# Patient Record
Sex: Male | Born: 1935 | Race: Black or African American | Hispanic: No | Marital: Married | Smoking: Former smoker
Health system: Southern US, Community
[De-identification: ages and names within clinical notes are randomized; demographics above are authoritative.]

## PROBLEM LIST (undated history)

## (undated) DIAGNOSIS — R0602 Shortness of breath: Secondary | ICD-10-CM

## (undated) DIAGNOSIS — I509 Heart failure, unspecified: Secondary | ICD-10-CM

## (undated) DIAGNOSIS — I10 Essential (primary) hypertension: Secondary | ICD-10-CM

## (undated) HISTORY — PX: BLADDER SURGERY: SHX569

---

## 1998-03-14 ENCOUNTER — Ambulatory Visit (HOSPITAL_COMMUNITY): Admission: RE | Admit: 1998-03-14 | Discharge: 1998-03-14 | Payer: Self-pay | Admitting: *Deleted

## 1998-08-06 ENCOUNTER — Encounter: Payer: Self-pay | Admitting: Urology

## 1998-08-06 ENCOUNTER — Ambulatory Visit (HOSPITAL_BASED_OUTPATIENT_CLINIC_OR_DEPARTMENT_OTHER): Admission: RE | Admit: 1998-08-06 | Discharge: 1998-08-06 | Payer: Self-pay | Admitting: Urology

## 1999-08-01 ENCOUNTER — Observation Stay (HOSPITAL_COMMUNITY): Admission: EM | Admit: 1999-08-01 | Discharge: 1999-08-02 | Payer: Self-pay | Admitting: Emergency Medicine

## 1999-08-01 ENCOUNTER — Encounter: Payer: Self-pay | Admitting: Emergency Medicine

## 2000-11-28 ENCOUNTER — Emergency Department (HOSPITAL_COMMUNITY): Admission: EM | Admit: 2000-11-28 | Discharge: 2000-11-28 | Payer: Self-pay | Admitting: Emergency Medicine

## 2011-06-24 ENCOUNTER — Inpatient Hospital Stay (HOSPITAL_COMMUNITY): Payer: Medicare Other

## 2011-06-24 ENCOUNTER — Encounter (HOSPITAL_COMMUNITY): Payer: Self-pay | Admitting: Emergency Medicine

## 2011-06-24 ENCOUNTER — Other Ambulatory Visit: Payer: Self-pay

## 2011-06-24 ENCOUNTER — Emergency Department (HOSPITAL_COMMUNITY): Payer: Medicare Other

## 2011-06-24 ENCOUNTER — Inpatient Hospital Stay (HOSPITAL_COMMUNITY)
Admission: EM | Admit: 2011-06-24 | Discharge: 2011-06-26 | DRG: 199 | Disposition: A | Discharge status: Home Health | Payer: Medicare Other | Source: Ambulatory Visit | Attending: Internal Medicine | Admitting: Internal Medicine

## 2011-06-24 DIAGNOSIS — E860 Dehydration: Secondary | ICD-10-CM

## 2011-06-24 DIAGNOSIS — F4321 Adjustment disorder with depressed mood: Secondary | ICD-10-CM | POA: Diagnosis present

## 2011-06-24 DIAGNOSIS — R6251 Failure to thrive (child): Secondary | ICD-10-CM

## 2011-06-24 DIAGNOSIS — J939 Pneumothorax, unspecified: Secondary | ICD-10-CM

## 2011-06-24 DIAGNOSIS — I10 Essential (primary) hypertension: Secondary | ICD-10-CM

## 2011-06-24 DIAGNOSIS — IMO0002 Reserved for concepts with insufficient information to code with codable children: Secondary | ICD-10-CM

## 2011-06-24 DIAGNOSIS — Z87891 Personal history of nicotine dependence: Secondary | ICD-10-CM

## 2011-06-24 DIAGNOSIS — I509 Heart failure, unspecified: Secondary | ICD-10-CM | POA: Diagnosis present

## 2011-06-24 DIAGNOSIS — K5900 Constipation, unspecified: Secondary | ICD-10-CM | POA: Diagnosis present

## 2011-06-24 DIAGNOSIS — J9383 Other pneumothorax: Secondary | ICD-10-CM

## 2011-06-24 DIAGNOSIS — R627 Adult failure to thrive: Secondary | ICD-10-CM

## 2011-06-24 DIAGNOSIS — E43 Unspecified severe protein-calorie malnutrition: Secondary | ICD-10-CM

## 2011-06-24 HISTORY — DX: Shortness of breath: R06.02

## 2011-06-24 HISTORY — DX: Heart failure, unspecified: I50.9

## 2011-06-24 HISTORY — DX: Essential (primary) hypertension: I10

## 2011-06-24 LAB — URINALYSIS, ROUTINE W REFLEX MICROSCOPIC
Bilirubin Urine: NEGATIVE
Glucose, UA: NEGATIVE mg/dL
Ketones, ur: NEGATIVE mg/dL
Leukocytes, UA: NEGATIVE
pH: 6 (ref 5.0–8.0)

## 2011-06-24 LAB — CBC
HCT: 29.2 % — ABNORMAL LOW (ref 39.0–52.0)
Hemoglobin: 10 g/dL — ABNORMAL LOW (ref 13.0–17.0)
MCH: 31.4 pg (ref 26.0–34.0)
MCV: 92.4 fL (ref 78.0–100.0)
MCV: 91.8 fL (ref 78.0–100.0)
Platelets: 153 10*3/uL (ref 150–400)
Platelets: 145 10*3/uL — ABNORMAL LOW (ref 150–400)
RBC: 3.18 MIL/uL — ABNORMAL LOW (ref 4.22–5.81)
RDW: 13.2 % (ref 11.5–15.5)
WBC: 10 10*3/uL (ref 4.0–10.5)
WBC: 8.7 10*3/uL (ref 4.0–10.5)

## 2011-06-24 LAB — COMPREHENSIVE METABOLIC PANEL
ALT: 9 U/L (ref 0–53)
Alkaline Phosphatase: 50 U/L (ref 39–117)
CO2: 30 mEq/L (ref 19–32)
GFR calc Af Amer: 50 mL/min — ABNORMAL LOW (ref >90)
GFR calc non Af Amer: 43 mL/min — ABNORMAL LOW (ref >90)
Glucose, Bld: 98 mg/dL (ref 70–99)
Potassium: 3.9 mEq/L (ref 3.5–5.1)
Sodium: 139 mEq/L (ref 135–145)
Total Bilirubin: 0.9 mg/dL (ref 0.3–1.2)

## 2011-06-24 LAB — CREATININE, SERUM: GFR calc Af Amer: 58 mL/min — ABNORMAL LOW (ref >90)

## 2011-06-24 LAB — DIFFERENTIAL
Basophils Absolute: 0 10*3/uL (ref 0.0–0.1)
Eosinophils Relative: 2 % (ref 0–5)
Lymphocytes Relative: 20 % (ref 12–46)

## 2011-06-24 LAB — URINE MICROSCOPIC-ADD ON

## 2011-06-24 MED ORDER — MORPHINE SULFATE 2 MG/ML IJ SOLN: 2.0000 mg | INTRAMUSCULAR | Status: DC | PRN

## 2011-06-24 MED ORDER — ONDANSETRON HCL 4 MG/2ML IJ SOLN: 4.0000 mg | Freq: Four times a day (QID) | INTRAMUSCULAR | Status: DC | PRN

## 2011-06-24 MED ORDER — ACETAMINOPHEN 650 MG RE SUPP: 650.0000 mg | Freq: Four times a day (QID) | RECTAL | Status: DC | PRN

## 2011-06-24 MED ORDER — SIMVASTATIN 40 MG PO TABS
40.0000 mg | ORAL_TABLET | Freq: Every morning | ORAL | Status: DC
  Administered 2011-06-24 – 2011-06-26 (×3): 40 mg via ORAL
  Filled 2011-06-24 (×3): qty 1

## 2011-06-24 MED ORDER — ENSURE CLINICAL ST REVIGOR PO LIQD
237.0000 mL | Freq: Three times a day (TID) | ORAL | Status: DC
  Administered 2011-06-24: 237 mL via ORAL
  Administered 2011-06-25: 14:00:00 via ORAL
  Administered 2011-06-25 – 2011-06-26 (×3): 237 mL via ORAL

## 2011-06-24 MED ORDER — ENOXAPARIN SODIUM 30 MG/0.3ML ~~LOC~~ SOLN
30.0000 mg | SUBCUTANEOUS | Status: DC
  Administered 2011-06-24 – 2011-06-25 (×2): 30 mg via SUBCUTANEOUS
  Filled 2011-06-24 (×3): qty 0.3

## 2011-06-24 MED ORDER — ONDANSETRON HCL 4 MG PO TABS: 4.0000 mg | ORAL_TABLET | Freq: Four times a day (QID) | ORAL | Status: DC | PRN

## 2011-06-24 MED ORDER — SODIUM CHLORIDE 0.9 % IV SOLN
INTRAVENOUS | Status: DC
  Administered 2011-06-24 – 2011-06-25 (×2): via INTRAVENOUS

## 2011-06-24 MED ORDER — ALPRAZOLAM 0.25 MG PO TABS: 0.2500 mg | ORAL_TABLET | Freq: Two times a day (BID) | ORAL | Status: DC | PRN

## 2011-06-24 MED ORDER — ACETAMINOPHEN 325 MG PO TABS: 650.0000 mg | ORAL_TABLET | Freq: Four times a day (QID) | ORAL | Status: DC | PRN

## 2011-06-24 MED ORDER — POLYETHYLENE GLYCOL 3350 17 G PO PACK
17.0000 g | PACK | Freq: Every day | ORAL | Status: DC
  Administered 2011-06-24 – 2011-06-25 (×2): 17 g via ORAL
  Filled 2011-06-24 (×2): qty 1

## 2011-06-24 MED ORDER — OXYCODONE HCL 5 MG PO TABS: 5.0000 mg | ORAL_TABLET | ORAL | Status: DC | PRN

## 2011-06-24 NOTE — H&P (Signed)
PCP:   Geraldo Pitter, MD, MD   Chief Complaint:  Poor appetite past few weeks, Weight loss of 2 pounds in last one month. Recent bereavement. Left flank pain.  HPI: This is 78 year old male, with history of HTN, s/p surgery for benign bladder tumor approximately 20 years ago, but otherwise, no significant medical history, brought by his family to ED, after he called his daughter Elease Hashimoto by phone this morning, with complaint of left upper back/flank pain. According to patient, he was doing some house cleaning about a month ago, when he had sudden onset left upper back pain, and had complained to his daughter at that time, that he felt as if he had "gas" in his back. According to family, he has been mildly SOBOE for one year, but this has become worse lately. He has no cough, chills or pleuritic-type chest pain. Patient lost his wife on 06/18/11, and she was buried on 06/22/11, so he is understandable, depressed. He has had poor appetite in the past few week, and lost about 2 lbs in weight in the past one month.  Allergies:  No Known Allergies According to family, patient is allergic to iv dye.   Past Medical History  Diagnosis Date  . Hypertension     Past Surgical History  Procedure Date  . Bladder surgery     had tumor removed 20 yrs ago    Prior to Admission medications   Medication Sig Start Date End Date Taking? Authorizing Provider  losartan-hydrochlorothiazide (HYZAAR) 100-25 MG per tablet Take 1 tablet by mouth daily.   Yes Historical Provider, MD  simvastatin (ZOCOR) 40 MG tablet Take 40 mg by mouth every morning.   Yes Historical Provider, MD    Social History:  does not have a smoking history on file. He does not have any smokeless tobacco history on file. His alcohol and drug histories not on file. Patient quit smoking >40 years ago.  History reviewed. No pertinent family history.  Review of Systems:  As per HPI and chief complaint. Patent denies fever, chills,  headache, blurred vision, difficulty in speaking, dysphagia, chest pain, cough, orthopnea, paroxysmal nocturnal dyspnea, nausea, diaphoresis, abdominal pain, vomiting, diarrhea, belching, heartburn, hematemesis, melena, dysuria, nocturia, urinary frequency, hematochezia, lower extremity swelling, pain, or redness. No bowel movement for last 3 days. The rest of the systems review is negative.  Physical Exam:  General:  Patient looks cachectic, does not appear to be in obvious acute distress. Alert, communicative, fully oriented, talking in complete sentences, not short of breath at rest. Buccal mucosa appears "dry".  HEENT:  Mild clinical pallor, no jaundice, no conjunctival injection or discharge. NECK:  Supple, JVP not seen, no carotid bruits, no palpable lymphadenopathy, no palpable goiter. CHEST:  Clinically clear to auscultation, no wheezes, no crackles. Breath sounds quiet over left apex. HEART:  Sounds 1 and 2 heard, normal, regular, no murmurs. ABDOMEN:  Flat, soft, non-tender, no palpable organomegaly, no palpable masses, normal bowel sounds. GENITALIA:  Not examined. LOWER EXTREMITIES:  No pitting edema, palpable peripheral pulses. MUSCULOSKELETAL SYSTEM:  Generalized osteoarthritic changes, otherwise, normal. CENTRAL NERVOUS SYSTEM:  No focal neurologic deficit on gross examination.  Labs on Admission:  Results for orders placed during the hospital encounter of 06/24/11 (from the past 48 hour(s))  COMPREHENSIVE METABOLIC PANEL     Status: Abnormal   Collection Time   06/24/11  8:16 AM      Component Value Range Comment   Sodium 139  135 - 145 (  mEq/L)    Potassium 3.9  3.5 - 5.1 (mEq/L)    Chloride 102  96 - 112 (mEq/L)    CO2 30  19 - 32 (mEq/L)    Glucose, Bld 98  70 - 99 (mg/dL)    BUN 27 (*) 6 - 23 (mg/dL)    Creatinine, Ser 1.61 (*) 0.50 - 1.35 (mg/dL)    Calcium 9.9  8.4 - 10.5 (mg/dL)    Total Protein 7.0  6.0 - 8.3 (g/dL)    Albumin 3.6  3.5 - 5.2 (g/dL)    AST 15  0 -  37 (U/L)    ALT 9  0 - 53 (U/L)    Alkaline Phosphatase 50  39 - 117 (U/L)    Total Bilirubin 0.9  0.3 - 1.2 (mg/dL)    GFR calc non Af Amer 43 (*) >90 (mL/min)    GFR calc Af Amer 50 (*) >90 (mL/min)   LIPASE, BLOOD     Status: Normal   Collection Time   06/24/11  8:16 AM      Component Value Range Comment   Lipase 35  11 - 59 (U/L)   LACTIC ACID, PLASMA     Status: Normal   Collection Time   06/24/11  8:16 AM      Component Value Range Comment   Lactic Acid, Venous 1.0  0.5 - 2.2 (mmol/L)   TROPONIN I     Status: Normal   Collection Time   06/24/11  8:16 AM      Component Value Range Comment   Troponin I <0.30  <0.30 (ng/mL)   CBC     Status: Abnormal   Collection Time   06/24/11  8:16 AM      Component Value Range Comment   WBC 10.0  4.0 - 10.5 (K/uL)    RBC 3.53 (*) 4.22 - 5.81 (MIL/uL)    Hemoglobin 11.1 (*) 13.0 - 17.0 (g/dL)    HCT 09.6 (*) 04.5 - 52.0 (%)    MCV 92.4  78.0 - 100.0 (fL)    MCH 31.4  26.0 - 34.0 (pg)    MCHC 34.0  30.0 - 36.0 (g/dL)    RDW 40.9  81.1 - 91.4 (%)    Platelets 153  150 - 400 (K/uL)   DIFFERENTIAL     Status: Abnormal   Collection Time   06/24/11  8:16 AM      Component Value Range Comment   Neutrophils Relative 61  43 - 77 (%)    Neutro Abs 6.1  1.7 - 7.7 (K/uL)    Lymphocytes Relative 20  12 - 46 (%)    Lymphs Abs 2.0  0.7 - 4.0 (K/uL)    Monocytes Relative 16 (*) 3 - 12 (%)    Monocytes Absolute 1.6 (*) 0.1 - 1.0 (K/uL)    Eosinophils Relative 2  0 - 5 (%)    Eosinophils Absolute 0.2  0.0 - 0.7 (K/uL)    Basophils Relative 0  0 - 1 (%)    Basophils Absolute 0.0  0.0 - 0.1 (K/uL)   URINALYSIS, ROUTINE W REFLEX MICROSCOPIC     Status: Abnormal   Collection Time   06/24/11  8:40 AM      Component Value Range Comment   Color, Urine YELLOW  YELLOW     APPearance CLEAR  CLEAR     Specific Gravity, Urine 1.020  1.005 - 1.030     pH 6.0  5.0 - 8.0  Glucose, UA NEGATIVE  NEGATIVE (mg/dL)    Hgb urine dipstick MODERATE (*) NEGATIVE       Bilirubin Urine NEGATIVE  NEGATIVE     Ketones, ur NEGATIVE  NEGATIVE (mg/dL)    Protein, ur NEGATIVE  NEGATIVE (mg/dL)    Urobilinogen, UA 0.2  0.0 - 1.0 (mg/dL)    Nitrite NEGATIVE  NEGATIVE     Leukocytes, UA NEGATIVE  NEGATIVE    URINE MICROSCOPIC-ADD ON     Status: Normal   Collection Time   06/24/11  8:40 AM      Component Value Range Comment   RBC / HPF 0-2  <3 (RBC/hpf)     Radiological Exams on Admission: *RADIOLOGY REPORT*  Clinical Data: Shortness of breath, CHF  CHEST - 2 VIEW  Comparison: Dictated report from portable chest x-ray of  08/06/1998.  Findings: There is a left pneumothorax present of approximately 15-  20%. Opacity is noted within the left upper lobe which may be  chronic, but an active process cannot be excluded. Opacity at the  left lung base may represent scarring or active process as well.  The right lung is clear and somewhat hyperaerated. Mediastinal  contours are within normal limits. The heart is normal in size.  No acute bony abnormality is seen.  IMPRESSION:  1. Approximately 20% left pneumothorax.  2. Prominent markings in the left lung apex and at the left lung  base may be chronic but an active process cannot be excluded.  I contacted Dr. Clarene Duke concerning this report at the time of  dictation.  Original Report Authenticated By: Juline Patch, M.D.   Assessment/Plan Principal Problem:  *FTT (failure to thrive) in adult: Patient presents with failure too thrive, diminished oral intake and and weight loss of 2 lbs over last month or so, without dysphagia or altered bowel habit. This has been exacerbated by recent bereavement. No clinical evidence of infection has been elicited. Will check TSH and request nutritionist consultation. Active Problems:  1. Protein-calorie malnutrition, severe: Secondary to above. Patient is cachectic. As decribed above will request nutritionist input.   2. Dehydration: Evidenced by elevated BUN/Creatinine  ratio. Will manage with gentle iv fluids.  3. HTN (hypertension): Patient is normotensive at this time.  4. Pneumothorax on left: This appears to be spontaneous, and acuity is uncertain. Per history, patient appears to have become symptomatic one month ago, but his left upper back/flank pain of this AM, could point to more recent occurrence or extension. According to Dr Clarene Duke, ED MD, she discussed this with Dr Robby Sermon, who suggested that this is likely chronic, and recommended observation, with serial CXR. Patient is not dyspnoiec at rest, has no chest pain, and is saturating well over 90% on RA. Will Evaluate further, with chest CT scan, and consult PCCM.  5. Constipation: This is not chronic, and patient's last bowel movement was 3 days ago. Will address with laxatives.  6. Recent bereavement: patient lost his wife of many years, on 06/18/11. He appears clinically depressed, which is an anticipated grief reaction. He will benefit from prn anxiolytics.  Further management will depend on clinical course.  Comment: I have discussed code status with family, and for, now, patient is FULL CODE.  Time Spent on Admission: 1 hour.  Luke Rigsbee,CHRISTOPHER 06/24/2011, 11:57 AM

## 2011-06-24 NOTE — ED Notes (Signed)
Called report to terry

## 2011-06-24 NOTE — ED Provider Notes (Signed)
History     CSN: [id number]  Arrival date & time 06/24/11  4098   First MD Initiated Contact with Patient 06/24/11 [phone]      Chief Complaint  Patient presents with  . Failure To Thrive    HPI Pt was seen at 0805.  Per pt and family, c/o gradual onset and worsening of persistent generalized weakness for the past 2-3 weeks.  Has been associated with poor PO intake, significant weight loss, as well as left sided mid-back "pain."  Pain worsens with palpation of the area and body movement.  Denies abd pain, no flank pain, no dysuria/hematuria, no CP/SOB, no cough, no fevers, no injury/falls.     Past Medical History  Diagnosis Date  . Hypertension     Past Surgical History  Procedure Date  . Bladder surgery     had tumor removed 20 yrs ago    History  Substance Use Topics  . Smoking status: Not on file  . Smokeless tobacco: Not on file  . Alcohol Use:     Review of Systems ROS: Statement: All systems negative except as marked or noted in the HPI; Constitutional: Negative for fever and chills. ; ; Eyes: Negative for eye pain, redness and discharge. ; ; ENMT: Negative for ear pain, hoarseness, nasal congestion, sinus pressure and sore throat. ; ; Cardiovascular: Negative for chest pain, palpitations, diaphoresis, dyspnea and peripheral edema. ; ; Respiratory: Negative for cough, wheezing and stridor. ; ; Gastrointestinal: Negative for nausea, vomiting, diarrhea, abdominal pain, blood in stool, hematemesis, jaundice and rectal bleeding. . ; ; Genitourinary: Negative for dysuria, flank pain and hematuria. ; ; Musculoskeletal: +back pain, Negative for neck pain. Negative for swelling and trauma.; ; Skin: Negative for pruritus, rash, abrasions, blisters, bruising and skin lesion.; ; Neuro: +weakness. Negative for headache, lightheadedness and neck stiffness. Negative for altered level of consciousness , altered mental status, extremity weakness, paresthesias, involuntary movement, seizure  and syncope.     Allergies  Review of patient's allergies indicates no known allergies.  Home Medications   Current Outpatient Rx  Name Route Sig Dispense Refill  . LOSARTAN POTASSIUM-HCTZ 100-25 MG PO TABS Oral Take 1 tablet by mouth daily.    Marland Kitchen SIMVASTATIN 40 MG PO TABS Oral Take 40 mg by mouth every morning.      BP 113/58  Pulse 86  Temp(Src) 97.9 F (36.6 C) (Oral)  Resp 17  SpO2 99%  Physical Exam 0810: Physical examination:  Nursing notes reviewed; Vital signs and O2 SAT reviewed;  Constitutional: Thin, frail, In no acute distress; Head:  Normocephalic, atraumatic; Eyes: EOMI, PERRL, No scleral icterus; ENMT: Mouth and pharynx normal, Mucous membranes dry; Neck: Supple, Full range of motion, No lymphadenopathy; Cardiovascular: Regular rate and rhythm, No murmur, rub, or gallop; Respiratory: Breath sounds clear & equal bilaterally, No rales, rhonchi, wheezes, or rub, Normal respiratory effort/excursion, speaking full sentences with ease; Chest: Nontender, Movement normal; Abdomen: Soft, Nontender, Nondistended, Normal bowel sounds; Genitourinary: No CVA tenderness; Spine:  No midline CS, TS, LS tenderness, +mild TTP left hypertonic thoracic paraspinal muscles, no rash, no ecchymosis, no soft tissue crepitus. Extremities: Pulses normal, No tenderness, No edema, No calf edema or asymmetry.; Neuro: AA&Ox3, Major CN grossly intact.  No facial droop, speech clear. No gross focal motor or sensory deficits in extremities.; Skin: Color normal, Warm, Dry, no rash.    ED Course  Procedures   [phone]:  Pt's VS remain stable, not orthostatic.  Sats remain 99% R/A, not  tachycardic or tachypneic.  T/C to Neurosurgeon Dr. Dorris Fetch, case discussed, including:  HPI, pertinent PM/SHx, VS/PE, dx testing, ED course and treatment:  He has reviewed CXR, PTX not 20% (appears less % than that) and given pt's stability likely is chronic/not acute today, recommends medical admit, recheck CXR 6hours and  again in the morning.   Dx testing d/w pt and family.  Questions answered.  Verb understanding, agreeable to admit.  1000:  T/C to [facility], case discussed, including:  HPI, pertinent PM/SHx, VS/PE, dx testing, D/W Neurosurg MD, ED course and treatment.  Agreeable to admit.  Requests to obtain medical bed to team 6.    MDM  MDM Reviewed: nursing note, vitals and previous chart Reviewed previous: ECG Interpretation: labs, ECG and x-ray    Date: 06/24/2011  Rate: 70  Rhythm: normal sinus rhythm  QRS Axis: normal  Intervals: normal  ST/T Wave abnormalities: normal  Conduction Disutrbances:none  Narrative Interpretation:   Old EKG Reviewed: unchanged; no significant changes from previous EKG dated 08/01/1999.  Results for orders placed during the hospital encounter of 06/24/11  COMPREHENSIVE METABOLIC PANEL      Component Value Range   Sodium 139  135 - 145 (mEq/L)   Potassium 3.9  3.5 - 5.1 (mEq/L)   Chloride 102  96 - 112 (mEq/L)   CO2 30  19 - 32 (mEq/L)   Glucose, Bld 98  70 - 99 (mg/dL)   BUN 27 (*) 6 - 23 (mg/dL)   Creatinine, Ser 1.61 (*) 0.50 - 1.35 (mg/dL)   Calcium 9.9  8.4 - 09.6 (mg/dL)   Total Protein 7.0  6.0 - 8.3 (g/dL)   Albumin 3.6  3.5 - 5.2 (g/dL)   AST 15  0 - 37 (U/L)   ALT 9  0 - 53 (U/L)   Alkaline Phosphatase 50  39 - 117 (U/L)   Total Bilirubin 0.9  0.3 - 1.2 (mg/dL)   GFR calc non Af Amer 43 (*) >90 (mL/min)   GFR calc Af Amer 50 (*) >90 (mL/min)  LIPASE, BLOOD      Component Value Range   Lipase 35  11 - 59 (U/L)  LACTIC ACID, PLASMA      Component Value Range   Lactic Acid, Venous 1.0  0.5 - 2.2 (mmol/L)  TROPONIN I      Component Value Range   Troponin I <0.30  <0.30 (ng/mL)  URINALYSIS, ROUTINE W REFLEX MICROSCOPIC      Component Value Range   Color, Urine YELLOW  YELLOW    APPearance CLEAR  CLEAR    Specific Gravity, Urine 1.020  1.005 - 1.030    pH 6.0  5.0 - 8.0    Glucose, UA NEGATIVE  NEGATIVE (mg/dL)   Hgb urine dipstick MODERATE  (*) NEGATIVE    Bilirubin Urine NEGATIVE  NEGATIVE    Ketones, ur NEGATIVE  NEGATIVE (mg/dL)   Protein, ur NEGATIVE  NEGATIVE (mg/dL)   Urobilinogen, UA 0.2  0.0 - 1.0 (mg/dL)   Nitrite NEGATIVE  NEGATIVE    Leukocytes, UA NEGATIVE  NEGATIVE   CBC      Component Value Range   WBC 10.0  4.0 - 10.5 (K/uL)   RBC 3.53 (*) 4.22 - 5.81 (MIL/uL)   Hemoglobin 11.1 (*) 13.0 - 17.0 (g/dL)   HCT 04.5 (*) 40.9 - 52.0 (%)   MCV 92.4  78.0 - 100.0 (fL)   MCH 31.4  26.0 - 34.0 (pg)   MCHC 34.0  30.0 -  36.0 (g/dL)   RDW 60.4  54.0 - 98.1 (%)   Platelets 153  150 - 400 (K/uL)  DIFFERENTIAL      Component Value Range   Neutrophils Relative 61  43 - 77 (%)   Neutro Abs 6.1  1.7 - 7.7 (K/uL)   Lymphocytes Relative 20  12 - 46 (%)   Lymphs Abs 2.0  0.7 - 4.0 (K/uL)   Monocytes Relative 16 (*) 3 - 12 (%)   Monocytes Absolute 1.6 (*) 0.1 - 1.0 (K/uL)   Eosinophils Relative 2  0 - 5 (%)   Eosinophils Absolute 0.2  0.0 - 0.7 (K/uL)   Basophils Relative 0  0 - 1 (%)   Basophils Absolute 0.0  0.0 - 0.1 (K/uL)  URINE MICROSCOPIC-ADD ON      Component Value Range   RBC / HPF 0-2  <3 (RBC/hpf)   Dg Chest 2 View 06/24/2011  *RADIOLOGY REPORT*  Clinical Data: Shortness of breath, CHF  CHEST - 2 VIEW  Comparison: Dictated report from portable chest x-ray of 08/06/1998.  Findings: There is a left pneumothorax present of approximately 15- 20%.  Opacity is noted within the left upper lobe which may be chronic, but an active process cannot be excluded. Opacity at the left lung base may represent scarring or active process as well. The right lung is clear and somewhat hyperaerated.  Mediastinal contours are within normal limits.  The heart is normal in size. No acute bony abnormality is seen.  IMPRESSION:  1.  Approximately 20% left pneumothorax. 2.  Prominent markings in the left lung apex and at the left lung base may be chronic but an active process cannot be excluded.  I contacted Dr. Clarene Duke concerning this report  at the time of dictation.  Original Report Authenticated By: Juline Patch, M.D.            Laray Anger, DO 06/25/11 [phone]

## 2011-06-24 NOTE — Consult Note (Signed)
Name: Marcus Clark MRN: [id number] DOB: 03/14/1916    LOS: 0  [facility] Note   History of Present Illness:  78 y/o AAM, former smoker, with PMH of CHF, HTN, and removal of bladder tumor 20 yrs ago, recent loss of spouse (deceased on 06/30/22) presented on 2/19 with "gas like" pain in back.  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  Noted to have lost weight to 120's from baseline of 140's over past year.  CXR in ED demonstrated approximate 20% left pneumothorax, chronic changes concerning for fibrosis.  PCCM consulted for PTX.    Tests / Events: 2/19 CT CHEST>>>Given cross modality comparison, similar, approximately 20% left-sided hydropneumothorax. Partial collapse of the left lung. Areas of left apical and basilar fibrosis. Given absent fibrosis on the right, considerations would include post infectious/inflammatory fibrosis or an atypical appearance of interstitial lung disease (i.e. usual interstitial pneumonitis or nonspecific interstitial pneumonitis.  Mildly motion degraded exam.    Past Medical History  Diagnosis Date  . Hypertension   . CHF (congestive heart failure)   . Shortness of breath     Past Surgical History  Procedure Date  . Bladder surgery     had tumor removed 20 yrs ago    Prior to Admission medications   Medication Sig Start Date End Date Taking? Authorizing Provider  losartan-hydrochlorothiazide (HYZAAR) 100-25 MG per tablet Take 1 tablet by mouth daily.   Yes Historical Provider, MD  simvastatin (ZOCOR) 40 MG tablet Take 40 mg by mouth every morning.   Yes Historical Provider, MD    Allergies Allergies  Allergen Reactions  . Contrast Media (Iodinated Diagnostic Agents)     Family History History reviewed. No pertinent family history.  Social History  reports that he has quit smoking. He has never used smokeless tobacco. He reports that he does not drink alcohol or use illicit drugs.  Review Of Systems:  Gen:  Denies fever, chills, fatigue, night sweats.  Gradual weight loss over last year HEENT: Denies blurred vision, double vision, hearing loss, tinnitus, sinus congestion, rhinorrhea, sore throat, neck stiffness, dysphagia PULM: Denies shortness of breath, cough, sputum production, hemoptysis, wheezing.  SEE HPI CV: Denies chest pain, edema, orthopnea, paroxysmal nocturnal dyspnea, palpitations GI: Denies abdominal pain, nausea, vomiting, diarrhea, hematochezia, melena, constipation, change in bowel habits GU: Denies dysuria, hematuria, polyuria, oliguria, urethral discharge Endocrine: Denies hot or cold intolerance, polyuria, polyphagia or appetite change Derm: Denies rash, dry skin, scaling or peeling skin change Heme: Denies easy bruising, bleeding, bleeding gums Neuro: Denies headache, numbness, weakness, slurred speech, loss of memory or consciousness  Vital Signs: Temp:  [96.9 F (36.1 C)-97.9 F (36.6 C)] 96.9 F (36.1 C) (02/19 1110) Pulse Rate:  [69-86] 74  (02/19 1110) Resp:  [17-18] 18  (02/19 1110) BP: (113-134)/(58-66) 134/66 mmHg (02/19 1110) SpO2:  [99 %-100 %] 100 % (02/19 1110)    Physical Examination: General: frail, elderly male in NAD Neuro: AAOx4, speech clear, MAE CV: s1s2 rrr, no m/r/g PULM: resp's even/non-labored on RA, lungs bilaterally clear GI: scaphoid, soft, bsx4 active Extremities:  Warm/dry, no edema  Labs    CBC  Lab 06/24/11 0816  HGB 11.1*  HCT 32.6*  WBC 10.0  PLT 153     BMET  Lab 06/24/11 0816  NA 139  K 3.9  CL 102  CO2 30  GLUCOSE 98  BUN 27*  CREATININE 1.37*  CALCIUM 9.9  MG --  PHOS --  Radiology: 2/19 CXR>>>Approximately 20% left pneumothorax.  Prominent markings in the left lung apex and at the left lung base may be chronic but an active process cannot be excluded    Assessment and Plan:  Spontaneous Left Pneumothorax Assessment: Frail elderly gentleman with 20% PTX.  Not in acute distress.   Plan: -serial  chest xrays to follow ptx -CT noted -add 100% NRB in setting of ptx -->ok for him to eat etc without mask on -hopeful to avoid CT placement -has grandson with multiple spnontaneous PTX's, family updated on plan of care, agree they hope to avoid CT placement -if acute distress, immediate cxr with presumed CT placement   Other current Medical Problems below per [facility]  FTT (failure to thrive) in adult / Protein-calorie malnutrition, severe / Dehydration   HTN (hypertension)  Constipation     Best practices / Disposition: -->Code Status:  -->DVT Px: -->GI Px: -->Diet:    Canary Brim, NP-C [facility] Pgr: [phone]  06/24/2011, 2:12 PM   PCCM Attending MD: Pt seen and examined and database reviewed. I agree with above findings, assessment and plan. The pneumothorax is relatively small and does not require intervention @ this point. The CT chest shows some adhesions in the pleural space which might serve to keep the lung "tacked up" preventing further collapse. Will recheck CXR in AM. Please call us sooner if he develops progressive respiratory distress  Billy Fischer, MD;  PCCM service; Mobile [phone]

## 2011-06-24 NOTE — Progress Notes (Signed)
Utilization Review Completed.Marcus Clark T2/19/2013   

## 2011-06-24 NOTE — Progress Notes (Signed)
INITIAL ADULT NUTRITION ASSESSMENT Date: 06/24/2011   Time: 2:10 PM  Reason for Assessment: Consult re: FTT, appears severely malnourished  ASSESSMENT: Male 78 y.o.  Dx: FTT (failure to thrive) in adult  Hx:  Past Medical History  Diagnosis Date  . Hypertension   . CHF (congestive heart failure)   . Shortness of breath    Related Meds:  Scheduled Meds:   . enoxaparin  30 mg Subcutaneous Q24H  . polyethylene glycol  17 g Oral Daily  . simvastatin  40 mg Oral q morning - 10a   Continuous Infusions:   . sodium chloride 75 mL/hr at 06/24/11 0826   PRN Meds:.acetaminophen, acetaminophen, ALPRAZolam, morphine, ondansetron (ZOFRAN) IV, ondansetron, oxyCODONE   Ht:  5\' 7"  (170.2 cm) per family  Wt:  122 lb (55.5 kg) per family  Ideal Wt: 67.3 kg % Ideal Wt: 82%  Usual Wt: 130 lb % Usual Wt: 94%  BMI: 19.2  Food/Nutrition Related Hx: Poor appetite and poor intake for the past few months with decline in wife's health and ability to prepare meals.  Wife passed away last week.  Patient is depressed per family, but eats well if someone eats with him.  Son and daughter prepare his meals.  Daughter bought a case of supplements for patient, but he has not been drinking them consistently because "he is stubborn" per daughter.  Labs:  CMP     Component Value Date/Time   NA 139 06/24/2011 0816   K 3.9 06/24/2011 0816   CL 102 06/24/2011 0816   CO2 30 06/24/2011 0816   GLUCOSE 98 06/24/2011 0816   BUN 27* 06/24/2011 0816   CREATININE 1.37* 06/24/2011 0816   CALCIUM 9.9 06/24/2011 0816   PROT 7.0 06/24/2011 0816   ALBUMIN 3.6 06/24/2011 0816   AST 15 06/24/2011 0816   ALT 9 06/24/2011 0816   ALKPHOS 50 06/24/2011 0816   BILITOT 0.9 06/24/2011 0816   GFRNONAA 43* 06/24/2011 0816   GFRAA 50* 06/24/2011 0816   Diet Order: Heart Healthy  Supplements/Tube Feeding: None  IVF:    sodium chloride Last Rate: 75 mL/hr at 06/24/11 0826    Estimated Nutritional Needs:   Kcal:  1500-1700 Protein: 75-85 grams Fluid: 1.5-1.7 liters  Patient is currently receiving an X-ray.  Spoke with his family (son, daughter, granddaughter) regarding nutrition history.  They request chocolate flavored supplements for patient.  Daughter reports that patient does not like hospital food, but ate well at lunch today because he was hungry.  6% weight loss in the past month with intake </=50% of estimated energy requirement for 1 month is reflective of severe malnutrition in the context of social or environmental circumstances.  Patient had been eating poorly when wife was unable to prepare meals.  NUTRITION DIAGNOSIS: -Malnutrition (NI-5.2).  Status: Ongoing  RELATED TO: inadequate oral intake with limited access to food and poor appetite  AS EVIDENCED BY: 6% weight loss in 1 month  MONITORING/EVALUATION(Goals): Goal:  Adequate intake of meals and supplements to meet nutrition needs for repletion of nutrition stores.  Monitor:  Adequacy of oral intake, labs, weight trend.  EDUCATION NEEDS: -Education not appropriate at this time  INTERVENTION:  Ensure Clinical Strength TID between meals to maximize oral intake.  Consider liberalizing diet to Regular to increase intake.  Dietitian #:  [phone]  DOCUMENTATION CODES Per approved criteria  -Severe  malnutrition in the context of social or environmental circumstances    Hettie Holstein 06/24/2011, 2:10 PM

## 2011-06-24 NOTE — ED Notes (Signed)
Pt son stated that pt wife died 2011-07-08 and was buried yesterday.  Pt has not been eating well since and family is concerned.

## 2011-06-24 NOTE — ED Notes (Signed)
Pt co left side abdominal pain.  Pt denies injury.  Denies N/V/D.  Pt states LBM Sunday, he hasnt been eating as much as normal.

## 2011-06-25 ENCOUNTER — Inpatient Hospital Stay (HOSPITAL_COMMUNITY): Payer: Medicare Other

## 2011-06-25 LAB — URINE CULTURE
Colony Count: NO GROWTH
Culture  Setup Time: 201302190920

## 2011-06-25 LAB — CBC
HCT: 27.3 % — ABNORMAL LOW (ref 39.0–52.0)
Hemoglobin: 9.2 g/dL — ABNORMAL LOW (ref 13.0–17.0)
MCH: 30.8 pg (ref 26.0–34.0)
MCHC: 33.7 g/dL (ref 30.0–36.0)
MCV: 91.3 fL (ref 78.0–100.0)
Platelets: 135 K/uL — ABNORMAL LOW (ref 150–400)
RBC: 2.99 MIL/uL — ABNORMAL LOW (ref 4.22–5.81)
RDW: 13.1 % (ref 11.5–15.5)
WBC: 7.1 K/uL (ref 4.0–10.5)

## 2011-06-25 LAB — COMPREHENSIVE METABOLIC PANEL
ALT: 7 U/L (ref 0–53)
AST: 13 U/L (ref 0–37)
Albumin: 2.6 g/dL — ABNORMAL LOW (ref 3.5–5.2)
Chloride: 107 mEq/L (ref 96–112)
Creatinine, Ser: 1.18 mg/dL (ref 0.50–1.35)
Potassium: 3.7 mEq/L (ref 3.5–5.1)
Sodium: 139 mEq/L (ref 135–145)
Total Bilirubin: 0.6 mg/dL (ref 0.3–1.2)

## 2011-06-25 LAB — TSH: TSH: 1.094 u[IU]/mL (ref 0.350–4.500)

## 2011-06-25 MED ORDER — POLYETHYLENE GLYCOL 3350 17 G PO PACK
17.0000 g | PACK | Freq: Every day | ORAL | Status: DC
  Administered 2011-06-26: 17 g via ORAL
  Filled 2011-06-25 (×2): qty 1

## 2011-06-25 MED ORDER — BIOTENE DRY MOUTH MT LIQD: 15.0000 mL | OROMUCOSAL | Status: DC | PRN

## 2011-06-25 MED ORDER — SENNOSIDES-DOCUSATE SODIUM 8.6-50 MG PO TABS
1.0000 | ORAL_TABLET | Freq: Every day | ORAL | Status: DC
  Administered 2011-06-25: 1 via ORAL
  Filled 2011-06-25 (×2): qty 1

## 2011-06-25 NOTE — Progress Notes (Signed)
Subjective: Has no requests      Physical Exam: Blood pressure 110/60, pulse 94, temperature 97 F (36.1 C), temperature source Oral, resp. rate 18, height 5\' 7"  (1.702 m), weight 55.339 kg (122 lb), SpO2 97.00%. Alert and oriented  Wearing NRB mask  CVS : RRR Bitemporal muscle atrophy     Investigations:  Recent Results (from the past 240 hour(s))  URINE CULTURE     Status: Normal   Collection Time   06/24/11  8:40 AM      Component Value Range Status Comment   Specimen Description URINE, RANDOM   Final    Special Requests NONE   Final    Culture  Setup Time [id number]   Final    Colony Count NO GROWTH   Final    Culture NO GROWTH   Final    Report Status 06/25/2011 FINAL   Final      Basic Metabolic Panel:  Basename 06/25/11 0450 06/24/11 1344 06/24/11 0816  NA 139 -- 139  K 3.7 -- 3.9  CL 107 -- 102  CO2 28 -- 30  GLUCOSE 88 -- 98  BUN 20 -- 27*  CREATININE 1.18 1.21 --  CALCIUM 8.6 -- 9.9  MG -- -- --  PHOS -- -- --   Liver Function Tests:  Basename 06/25/11 0450 06/24/11 0816  AST 13 15  ALT 7 9  ALKPHOS 41 50  BILITOT 0.6 0.9  PROT 5.6* 7.0  ALBUMIN 2.6* 3.6     CBC:  Basename 06/25/11 0450 06/24/11 1344 06/24/11 0816  WBC 7.1 8.7 --  NEUTROABS -- -- 6.1  HGB 9.2* 10.0* --  HCT 27.3* 29.2* --  MCV 91.3 91.8 --  PLT 135* 145* --    Dg Chest 2 View  06/24/2011  *RADIOLOGY REPORT*  Clinical Data: Shortness of breath, CHF  CHEST - 2 VIEW  Comparison: Dictated report from portable chest x-ray of 08/06/1998.  Findings: There is a left pneumothorax present of approximately 15- 20%.  Opacity is noted within the left upper lobe which may be chronic, but an active process cannot be excluded. Opacity at the left lung base may represent scarring or active process as well. The right lung is clear and somewhat hyperaerated.  Mediastinal contours are within normal limits.  The heart is normal in size. No acute bony abnormality is seen.  IMPRESSION:  1.   Approximately 20% left pneumothorax. 2.  Prominent markings in the left lung apex and at the left lung base may be chronic but an active process cannot be excluded.  I contacted Dr. Clarene Duke concerning this report at the time of dictation.  Original Report Authenticated By: Juline Patch, M.D.   Ct Chest Wo Contrast  06/24/2011  *RADIOLOGY REPORT*  Clinical Data: Evaluate left pneumothorax.  Left upper back / flank pain.  Shortness of breath for 1 year.  Worsening lately.  CT CHEST WITHOUT CONTRAST  Technique:  Multidetector CT imaging of the chest was performed following the standard protocol without IV contrast.  Comparison: Plain film of earlier today.  No prior CT.  Findings: Lung windows demonstrate mild motion degradation inferiorly.  Scattered areas of scarring, including within the right lung base.  A small to moderate residual left-sided pneumothorax.  No chest tube identified.  The left lung is partially collapsed.  Within the left apex, there is probable bronchiectasis possible combined with minimal subpleural honeycombing.  Example images 15 - 17 of series 3. Series suboptimally evaluated secondary to the  concurrent extent of volume loss/collapse. Similarly, within the left base, there is a suggestion of mild subpleural reticulation, including on image 49.  Linear scar or atelectasis in the left upper lobe on image 19.  No convincing evidence of fibrosis within the right lung.  No bronchopleural fistula.  Soft tissue windows demonstrate tortuous descending thoracic aorta. Normal heart size with a small left-sided pleural effusion.  No pericardial effusion.  Ascending right upper normal in size, 3.9 cm. No mediastinal or definite hilar adenopathy, given limitations of unenhanced CT.  Limited abdominal imaging demonstrates remote granulomatous disease in the liver.  Motion degradation continuing into the upper abdomen. No acute osseous abnormality.  IMPRESSION:  1.  Given cross modality comparison,  similar, approximately 20% left-sided hydropneumothorax. 2.  Partial collapse of the left lung.  Areas of left apical and basilar fibrosis.  Given absent fibrosis on the right, considerations would include post infectious/inflammatory fibrosis or an atypical appearance of interstitial lung disease (i.e. usual interstitial pneumonitis or nonspecific interstitial pneumonitis. 3.  Mildly motion degraded exam.  Original Report Authenticated By: Consuello Bossier, M.D.   Dg Chest Port 1 View  06/25/2011  *RADIOLOGY REPORT*  Clinical Data: Follow up left pneumothorax  PORTABLE CHEST - 1 VIEW  Comparison: 06/24/2011  Findings: Cardiomediastinal silhouette is stable. No pulmonary edema.  Stable small left upper pneumothorax.  Stable atelectasis in the left upper lobe and left base.  Question small left pleural effusion.  IMPRESSION: Stable small left pneumothorax.  Stable atelectasis in the left upper lobe and left base.  Question small left pleural effusion.  Original Report Authenticated By: Natasha Mead, M.D.   Dg Chest Port 1 View  06/24/2011  *RADIOLOGY REPORT*  Clinical Data: 78 year old male with pneumothorax.  PORTABLE CHEST - 1 VIEW  Comparison: Chest CT 1441 hours the same day and earlier.  Findings: AP portable seated upright view 1901 hours.  Small to moderate left pneumothorax is not significantly changed.  Pleural edge now primarily visible medially.  Stable patchy increased density in the left lung.  No right pneumothorax.  No pulmonary edema or significant effusion.  Stable cardiac size and mediastinal contours.  IMPRESSION: Stable small to moderate left pneumothorax.  Original Report Authenticated By: Harley Hallmark, M.D.      Medications:  Scheduled:   . enoxaparin  30 mg Subcutaneous Q24H  . feeding supplement  237 mL Oral TID BM  . polyethylene glycol  17 g Oral Daily  . senna-docusate  1 tablet Oral QHS  . simvastatin  40 mg Oral q morning - 10a  . DISCONTD: polyethylene glycol  17 g Oral  Daily   Continuous:   . sodium chloride 75 mL/hr at 06/25/11 1439    Impression:  Principal Problem:  *FTT (failure to thrive) in adult Active Problems:  Protein-calorie malnutrition, severe  HTN (hypertension)  Pneumothorax on left  Constipation  Dehydration     Plan: Continue current plan  PT consult      LOS: 1 day   Louise Rawson, MD Pager: [phone] 06/25/2011, 5:43 PM

## 2011-06-25 NOTE — Progress Notes (Signed)
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  Noted to have lost weight to 120's from baseline of 140's over past year.  CXR in ED demonstrated approximate 20% left pneumothorax, chronic changes concerning for fibrosis.  PCCM consulted for PTX.    Tests / Events: 2/19 CT CHEST>>>Given cross modality comparison, similar, approximately 20% left-sided hydropneumothorax. Partial collapse of the left lung. Areas of left apical and basilar fibrosis. Given absent fibrosis on the right, considerations would include post infectious/inflammatory fibrosis or an atypical appearance of interstitial lung disease (i.e. usual interstitial pneumonitis or nonspecific interstitial pneumonitis.  Mildly motion degraded exam.   Overnight Feels better, eating ok per fm, no sob or cp   Vital Signs: BP 118/60  Pulse 60  Temp(Src) 96.7 F (35.9 C) (Axillary)  Resp 18  Ht 5\' 7"  (1.702 m)  Wt 122 lb (55.339 kg)  BMI 19.11 kg/m2  SpO2 100%   Physical Examination: General: frail, elderly male in NAD Neuro: AAOx4, speech clear, MAE CV: s1s2 rrr, no m/r/g PULM: resp's even/non-labored on RA, lungs bilaterally clear GI: scaphoid, soft, bsx4 active Extremities:  Warm/dry, no edema  Labs    CBC  Lab 06/25/11 0450 06/24/11 1344 06/24/11 0816  HGB 9.2* 10.0* 11.1*  HCT 27.3* 29.2* 32.6*  WBC 7.1 8.7 10.0  PLT 135* 145* 153     BMET  Lab 06/25/11 0450 06/24/11 1344 06/24/11 0816  NA 139 -- 139  K 3.7 -- 3.9  CL 107 -- 102  CO2 28 -- 30  GLUCOSE 88 -- 98  BUN 20 -- 27*  CREATININE 1.18 1.21 1.37*  CALCIUM 8.6 -- 9.9  MG -- -- --  PHOS -- -- --     Radiology: 2/20 CXR>>>Stable small left  pneumothorax. Stable atelectasis in the left  upper lobe and left base. Question small left pleural effusion.   Assessment and Plan:  Spontaneous Left Pneumothorax Assessment: Frail elderly gentleman with 20% PTX.  Not in acute distress.   Plan: -serial chest xrays to follow ptx -CT noted -add 100% NRB in setting of ptx -->ok for him to eat etc without mask on -hopeful to avoid CT placement -has grandson with multiple spnontaneous PTX's, family updated on plan of care, agree they hope to avoid CT placement -if acute distress, immediate cxr with presumed CT placement     Sandrea Hughs, MD Pulmonary and Critical Care Medicine [facility] Cell [phone]

## 2011-06-26 ENCOUNTER — Inpatient Hospital Stay (HOSPITAL_COMMUNITY): Payer: Medicare Other

## 2011-06-26 DIAGNOSIS — J9383 Other pneumothorax: Secondary | ICD-10-CM

## 2011-06-26 MED ORDER — ENSURE CLINICAL ST REVIGOR PO LIQD: 237.0000 mL | Freq: Three times a day (TID) | ORAL | Status: DC

## 2011-06-26 NOTE — Evaluation (Signed)
Physical Therapy Evaluation and Discharge Patient Details Name: Marcus Clark MRN: [id number] DOB: [birth year] Today's Date: 06/26/2011  Problem List:  Patient Active Problem List  Diagnoses  . FTT (failure to thrive) in adult  . Protein-calorie malnutrition, severe  . HTN (hypertension)  . Pneumothorax on left  . Constipation  . Dehydration    Past Medical History:  Past Medical History  Diagnosis Date  . Hypertension   . CHF (congestive heart failure)   . Shortness of breath    Past Surgical History:  Past Surgical History  Procedure Date  . Bladder surgery     had tumor removed 20 yrs ago    PT Assessment/Plan/Recommendation PT Assessment Clinical Impression Statement: Pt is a very pleasant and strong willed 78 y/o male who lived with his disabled grandson Independently prior to admission.  Today pt demonstates independence with all mobility and was able to easily tolerate ambulating 150 feet with no assistive device on Room air with O2 sats greater than 90 throughout session.  Pt also was able to perform self care activities such as donning his socks and pants, and cleaning himself after having a bowel movement.  Acute PT/OT signing off.   PT Recommendation/Assessment: Patent does not need any further PT services No Skilled PT: Patient at baseline level of functioning;Patient is independent with all acitivity/mobility PT Recommendation Follow Up Recommendations: No PT follow up Equipment Recommended: None recommended by PT;None recommended by OT PT Goals     PT Evaluation Precautions/Restrictions  Precautions Precautions: Fall Restrictions Weight Bearing Restrictions: No Prior Functioning  Home Living Lives With: Family Receives Help From: Family (Disabled grandson. ) Type of Home: Apartment Home Layout: One level Home Access: Level entry Bathroom Shower/Tub: Tub/shower unit;Curtain Bathroom Toilet: Standard Bathroom Accessibility: Yes How Accessible:  Accessible via walker Home Adaptive Equipment: Walker - rolling;Bedside commode/3-in-1;Shower chair with back Prior Function Level of Independence: Independent with basic ADLs;Independent with homemaking with ambulation;Independent with gait;Independent with transfers Able to Take Stairs?: Yes Driving: Yes Vocation: Retired Leisure: Hobbies-yes (Comment) Comments: take care of his disabled grandson Cognition Cognition Arousal/Alertness: Awake/alert Overall Cognitive Status: Appears within functional limits for tasks assessed Sensation/Coordination Sensation Light Touch: Appears Intact Stereognosis: Not tested Hot/Cold: Not tested Proprioception: Not tested Coordination Gross Motor Movements are Fluid and Coordinated: Yes Fine Motor Movements are Fluid and Coordinated: Yes Extremity Assessment RUE Assessment RUE Assessment: Within Functional Limits LUE Assessment LUE Assessment: Within Functional Limits RLE Assessment RLE Assessment: Within Functional Limits LLE Assessment LLE Assessment: Within Functional Limits Mobility (including Balance) Bed Mobility Bed Mobility: Yes Supine to Sit: 7: Independent;HOB flat Sit to Supine: 7: Independent;HOB flat Transfers Transfers: Yes Sit to Stand: 7: Independent;With upper extremity assist;From bed;From chair/3-in-1;With armrests Stand to Sit: 7: Independent;With upper extremity assist;To bed;To chair/3-in-1;With armrests Stand Pivot Transfers: 5: Supervision Stand Pivot Transfer Details (indicate cue type and reason): Supervison for safety, no instruction or assistance required.  Ambulation/Gait Ambulation/Gait: Yes Ambulation/Gait Assistance: 7: Independent (supervision for safety) Ambulation Distance (Feet): 150 Feet Assistive device: None Gait Pattern: Within Functional Limits Gait velocity: WFL Stairs: No Wheelchair Mobility Wheelchair Mobility: No  Posture/Postural Control Posture/Postural Control: Postural  limitations Postural Limitations: Kyphosis secondary to anterior thoracic curve.  Balance Balance Assessed: Yes Static Sitting Balance Static Sitting - Balance Support: No upper extremity supported Static Sitting - Level of Assistance: 7: Independent Static Sitting - Comment/# of Minutes: several minutes sittin on EOB while performing functional task.   Dynamic Sitting Balance Dynamic Sitting - Balance Activities: Reaching  for objects;Reaching across midline Dynamic Standing Balance Dynamic Standing - Balance Activities: Reaching for objects;Reaching across midline;Forward lean/weight shifting Exercise    End of Session PT - End of Session Equipment Utilized During Treatment: Gait belt Activity Tolerance: Patient tolerated treatment well Patient left: in bed;with call bell in reach;with family/visitor present;with bed alarm set Nurse Communication: Mobility status for transfers;Mobility status for ambulation General Behavior During Session: [facility] for tasks performed Cognition: [facility] for tasks performed  Jamile Sivils 06/26/2011, 11:45 AM Janeth Terry L. Adarian Bur DPT [phone]

## 2011-06-26 NOTE — Progress Notes (Addendum)
Reviewed home care d/c instructions with son. He states he has no questions at this time. Portable oxygen tank at bedside for use.  Instructed patient on how to place oxygen tubing to his nose and secure under his chin.  Patient getting dressed with assistance by his son.  Patient to be discharged via wheelchair to the car.

## 2011-06-26 NOTE — Progress Notes (Signed)
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  Noted to have lost weight to 120's from baseline of 140's over past year.  CXR in ED demonstrated approximate 20% left pneumothorax, chronic changes concerning for fibrosis.  PCCM consulted for PTX.    Tests / Events: 2/19 CT CHEST>>>Given cross modality comparison, similar, approximately 20% left-sided hydropneumothorax. Partial collapse of the left lung. Areas of left apical and basilar fibrosis. Given absent fibrosis on the right, considerations would include post infectious/inflammatory fibrosis or an atypical appearance of interstitial lung disease (i.e. usual interstitial pneumonitis or nonspecific interstitial pneumonitis.  Mildly motion degraded exam.   Overnight Feels better, eating ok per fm, no sob or cp   Vital Signs: BP 137/68  Pulse 80  Temp(Src) 97 F (36.1 C) (Oral)  Resp 20  Ht 5\' 7"  (1.702 m)  Wt 55.339 kg (122 lb)  BMI 19.11 kg/m2  SpO2 100%   Physical Examination: General: frail, elderly male in NAD Neuro: AAOx4, speech clear, MAE CV: s1s2 rrr, no m/r/g PULM: resp's even/non-labored on RA, lungs bilaterally clear GI: scaphoid, soft, bsx4 active Extremities:  Warm/dry, no edema  Labs    CBC  Lab 06/25/11 0450 06/24/11 1344 06/24/11 0816  HGB 9.2* 10.0* 11.1*  HCT 27.3* 29.2* 32.6*  WBC 7.1 8.7 10.0  PLT 135* 145* 153     BMET  Lab 06/25/11 0450 06/24/11 1344 06/24/11 0816  NA 139 -- 139  K 3.7 -- 3.9  CL 107 -- 102  CO2 28 -- 30  GLUCOSE 88 -- 98  BUN 20 -- 27*  CREATININE 1.18 1.21 1.37*  CALCIUM 8.6 -- 9.9  MG -- -- --  PHOS -- -- --     Radiology: 2/20 CXR>>>Stable small left  pneumothorax. Stable atelectasis in the left  upper lobe and left base. Question small left pleural effusion.   Assessment and Plan:  Spontaneous Left Pneumothorax Assessment: Frail elderly gentleman with 20% PTX.  Not in acute distress.   Plan: -serial chest xrays to follow ptx, will obtain one today -added 100% NRB in setting of ptx -->ok for him to eat etc without mask on -hopeful to avoid CT placement -has grandson with multiple spnontaneous PTX's, family updated on plan of care, agree they hope to avoid CT placement -if acute distress, immediate cxr with presumed CT placement  Ok to discharge to outpt f/u in one week with cxr  Sandrea Hughs, MD Pulmonary and Critical Care Medicine [facility] Cell [phone]

## 2011-06-26 NOTE — Progress Notes (Signed)
   CARE MANAGEMENT NOTE 06/26/2011  Patient:  Marcus Clark, Marcus Clark   Account Number:  [id number]  Date Initiated:  06/25/2011  Documentation initiated by:  Heron Pitcock  Subjective/Objective Assessment:   78 yr-old male adm with dehydration, malnutrition; lives with grandson, independent PTA.     Anticipated DC Date:  06/29/2011   Anticipated DC Plan:  HOME W HOME HEALTH SERVICES      DC Planning Services  CM consult      [facility] Choice  HOME HEALTH  DURABLE MEDICAL EQUIPMENT   Choice offered to / List presented to:  C-4 Adult Children   DME arranged  [facility]      DME agency  [facility]     [facility] arranged  HH-1 RN      [facility] agency  [facility]   Status of service:  Completed, signed off  Discharge Disposition:  HOME W HOME HEALTH SERVICES  Comments:  PCP:  Dr. Renaye Rakers  06/26/11 1330 Margeret Stachnik RN MSN CCM Pt will need home O2 1-2 months but RA sats on ambulation are 90%.  Cost information for home O2 provided to daughter and son, they will pay privately.  Pt will also need home health RN to assess/monitor heart/lung sounds, O2 sats, PO intake.  Provided daughter with list of agencies, referral made to [facility] - referral was refused by [facility] 2/2 to payor.  Provided list again and referred to 2nd choice.

## 2011-06-26 NOTE — Progress Notes (Signed)
Subjective: Has no requests      Physical Exam: Blood pressure 137/68, pulse 80, temperature 97 F (36.1 C), temperature source Oral, resp. rate 20, height 5\' 7"  (1.702 m), weight 55.339 kg (122 lb), SpO2 100.00%.     Investigations:  Recent Results (from the past 240 hour(s))  URINE CULTURE     Status: Normal   Collection Time   06/24/11  8:40 AM      Component Value Range Status Comment   Specimen Description URINE, RANDOM   Final    Special Requests NONE   Final    Culture  Setup Time [id number]   Final    Colony Count NO GROWTH   Final    Culture NO GROWTH   Final    Report Status 06/25/2011 FINAL   Final      Basic Metabolic Panel:  Basename 06/25/11 0450 06/24/11 1344 06/24/11 0816  NA 139 -- 139  K 3.7 -- 3.9  CL 107 -- 102  CO2 28 -- 30  GLUCOSE 88 -- 98  BUN 20 -- 27*  CREATININE 1.18 1.21 --  CALCIUM 8.6 -- 9.9  MG -- -- --  PHOS -- -- --   Liver Function Tests:  Basename 06/25/11 0450 06/24/11 0816  AST 13 15  ALT 7 9  ALKPHOS 41 50  BILITOT 0.6 0.9  PROT 5.6* 7.0  ALBUMIN 2.6* 3.6     CBC:  Basename 06/25/11 0450 06/24/11 1344 06/24/11 0816  WBC 7.1 8.7 --  NEUTROABS -- -- 6.1  HGB 9.2* 10.0* --  HCT 27.3* 29.2* --  MCV 91.3 91.8 --  PLT 135* 145* --    Ct Chest Wo Contrast  06/24/2011  *RADIOLOGY REPORT*  Clinical Data: Evaluate left pneumothorax.  Left upper back / flank pain.  Shortness of breath for 1 year.  Worsening lately.  CT CHEST WITHOUT CONTRAST  Technique:  Multidetector CT imaging of the chest was performed following the standard protocol without IV contrast.  Comparison: Plain film of earlier today.  No prior CT.  Findings: Lung windows demonstrate mild motion degradation inferiorly.  Scattered areas of scarring, including within the right lung base.  A small to moderate residual left-sided pneumothorax.  No chest tube identified.  The left lung is partially collapsed.  Within the left apex, there is probable  bronchiectasis possible combined with minimal subpleural honeycombing.  Example images 15 - 17 of series 3. Series suboptimally evaluated secondary to the concurrent extent of volume loss/collapse. Similarly, within the left base, there is a suggestion of mild subpleural reticulation, including on image 49.  Linear scar or atelectasis in the left upper lobe on image 19.  No convincing evidence of fibrosis within the right lung.  No bronchopleural fistula.  Soft tissue windows demonstrate tortuous descending thoracic aorta. Normal heart size with a small left-sided pleural effusion.  No pericardial effusion.  Ascending right upper normal in size, 3.9 cm. No mediastinal or definite hilar adenopathy, given limitations of unenhanced CT.  Limited abdominal imaging demonstrates remote granulomatous disease in the liver.  Motion degradation continuing into the upper abdomen. No acute osseous abnormality.  IMPRESSION:  1.  Given cross modality comparison, similar, approximately 20% left-sided hydropneumothorax. 2.  Partial collapse of the left lung.  Areas of left apical and basilar fibrosis.  Given absent fibrosis on the right, considerations would include post infectious/inflammatory fibrosis or an atypical appearance of interstitial lung disease (i.e. usual interstitial pneumonitis or nonspecific interstitial pneumonitis. 3.  Mildly  motion degraded exam.  Original Report Authenticated By: Consuello Bossier, M.D.   Dg Chest Port 1 View  06/25/2011  *RADIOLOGY REPORT*  Clinical Data: Follow up left pneumothorax  PORTABLE CHEST - 1 VIEW  Comparison: 06/24/2011  Findings: Cardiomediastinal silhouette is stable. No pulmonary edema.  Stable small left upper pneumothorax.  Stable atelectasis in the left upper lobe and left base.  Question small left pleural effusion.  IMPRESSION: Stable small left pneumothorax.  Stable atelectasis in the left upper lobe and left base.  Question small left pleural effusion.  Original Report  Authenticated By: Natasha Mead, M.D.   Dg Chest Port 1 View  06/24/2011  *RADIOLOGY REPORT*  Clinical Data: 78 year old male with pneumothorax.  PORTABLE CHEST - 1 VIEW  Comparison: Chest CT 1441 hours the same day and earlier.  Findings: AP portable seated upright view 1901 hours.  Small to moderate left pneumothorax is not significantly changed.  Pleural edge now primarily visible medially.  Stable patchy increased density in the left lung.  No right pneumothorax.  No pulmonary edema or significant effusion.  Stable cardiac size and mediastinal contours.  IMPRESSION: Stable small to moderate left pneumothorax.  Original Report Authenticated By: Harley Hallmark, M.D.      Medications:  Scheduled:    . enoxaparin  30 mg Subcutaneous Q24H  . feeding supplement  237 mL Oral TID BM  . polyethylene glycol  17 g Oral Daily  . senna-docusate  1 tablet Oral QHS  . simvastatin  40 mg Oral q morning - 10a  . DISCONTD: polyethylene glycol  17 g Oral Daily   Continuous:    . DISCONTD: sodium chloride 75 mL/hr at 06/25/11 1439    Impression:  Principal Problem:  *FTT (failure to thrive) in adult Active Problems:  Protein-calorie malnutrition, severe  HTN (hypertension)  Pneumothorax on left  Constipation  Dehydration     Plan: D/c home with oxygen with f/u with dr wert       LOS: 2 days   Lonia Blood, MD Pager: [phone] 06/26/2011, 9:46 AM

## 2011-06-26 NOTE — Evaluation (Signed)
Occupational Therapy Evaluation Patient Details Name: Marcus Clark MRN: [id number] DOB: [birth year] Today's Date: 06/26/2011  Problem List:  Patient Active Problem List  Diagnoses  . FTT (failure to thrive) in adult  . Protein-calorie malnutrition, severe  . HTN (hypertension)  . Pneumothorax on left  . Constipation  . Dehydration    Past Medical History:  Past Medical History  Diagnosis Date  . Hypertension   . CHF (congestive heart failure)   . Shortness of breath    Past Surgical History:  Past Surgical History  Procedure Date  . Bladder surgery     had tumor removed 20 yrs ago    OT Assessment/Plan/Recommendation OT Assessment Clinical Impression Statement: Pt is a 78 year old man recovering from pneumothorax.  He was independent in mobility and ADL PTA but family reports progressive decline in meal prep and eating PTA.  Pt is not likely to regard recommendations below.  Family states they will be providing more supervision of pt upon return home.  No further OT needs. OT Recommendation/Assessment: Patient does not need any further OT services OT Recommendation Follow Up Recommendations: Supervision - Intermittent Equipment Recommended: None recommended by PT;None recommended by OT OT Goals    OT Evaluation Precautions/Restrictions  Precautions Precautions: Fall Restrictions Weight Bearing Restrictions: No Prior Functioning Home Living Lives With: Other (Comment) (disabled grandson) Receives Help From: Family Type of Home: Apartment Home Layout: One level Home Access: Level entry Bathroom Shower/Tub: Tub/shower unit;Curtain Bathroom Toilet: Standard Bathroom Accessibility: Yes How Accessible: Accessible via walker Home Adaptive Equipment: Walker - rolling;Bedside commode/3-in-1;Shower chair with back Prior Function Level of Independence: Independent with basic ADLs;Independent with homemaking with ambulation;Independent with gait;Independent with  transfers Able to Take Stairs?: Yes Driving: No Vocation: Retired Leisure: Hobbies-yes (Comment) Comments: take care of his disabled grandson ADL ADL Eating/Feeding: Performed;Independent Where Assessed - Eating/Feeding: Edge of bed Grooming: Performed;Wash/dry hands;Independent Where Assessed - Grooming: Standing at sink Upper Body Bathing: Simulated;Set up Where Assessed - Upper Body Bathing: Sitting, bed Lower Body Bathing: Simulated;Set up Where Assessed - Lower Body Bathing: Sitting, bed;Sit to stand from bed Upper Body Dressing: Performed;Set up Where Assessed - Upper Body Dressing: Sitting, bed Lower Body Dressing: Performed;Set up Where Assessed - Lower Body Dressing: Sitting, bed;Sit to stand from bed Toilet Transfer: Performed;Modified independent Toilet Transfer Method: Proofreader: Regular height toilet Toileting - Clothing Manipulation: Performed;Independent Where Assessed - Toileting Clothing Manipulation: Standing Toileting - Hygiene: Simulated;Independent Where Assessed - Toileting Hygiene: Sit on 3-in-1 or toilet Tub/Shower Transfer: Simulated;Other (comment) (pt got down to floor and back up with supervision to simulat) Tub/Shower Transfer Equipment: Other (comment) (recommended pt use shower seat at home, refrain from tub bat) Ambulation Related to ADLs: Independent in room. ADL Comments: Pt moves well, but demonstrates decreased endurance particularly for LB ADL.  Does not appear to appreciate his deficits in activity tolerance.  Advised daughter to provide more regular supervision for pt's safety.  Discussed with pt issues related to getting down in tub for bathing and for cleaning out the tub.  Advised him to have family assist him and avoid cleaners that are irritating to respiratory system. Vision/Perception  Vision - History Baseline Vision: Wears glasses only for reading Patient Visual Report: No change from  baseline Cognition Cognition Arousal/Alertness: Awake/alert Overall Cognitive Status: Appears within functional limits for tasks assessed Cognition - Other Comments: Daughter describes pt a stubborn with deficits in judgement. Sensation/Coordination Sensation Light Touch: Appears Intact Stereognosis: Not tested Hot/Cold: Not tested  Proprioception: Not tested Coordination Gross Motor Movements are Fluid and Coordinated: Yes Fine Motor Movements are Fluid and Coordinated: Yes Extremity Assessment RUE Assessment RUE Assessment: Within Functional Limits LUE Assessment LUE Assessment: Within Functional Limits Mobility  Bed Mobility Bed Mobility: Yes Supine to Sit: 7: Independent;HOB flat Sit to Supine: 7: Independent;HOB flat Transfers Transfers: Yes Sit to Stand: 7: Independent;With upper extremity assist;From bed;From chair/3-in-1;With armrests Stand to Sit: 7: Independent;With upper extremity assist;To bed;To chair/3-in-1;With armrests End of Session OT - End of Session Activity Tolerance: Patient tolerated treatment well Patient left: in bed;with call bell in reach;with bed alarm set;with family/visitor present General Behavior During Session: [facility] for tasks performed Cognition: [facility] for tasks performed   Evern Bio 06/26/2011, 2:57 PM

## 2011-06-29 NOTE — Discharge Summary (Signed)
Patient ID: Marcus Clark MRN: [id number] DOB/AGE: 05-09-1915 78 y.o. Primary Care Physician:BLAND,VEITA J, MD, MD Admit date: 06/24/2011 Discharge date: 06/29/2011    Discharge Diagnoses:   Principal Problem:  *FTT (failure to thrive) in adult Active Problems:  Protein-calorie malnutrition, severe  HTN (hypertension)  Pneumothorax on left  Constipation  Dehydration   Medication List  As of 06/29/2011  7:09 AM   START taking these medications         feeding supplement Liqd   Take 237 mLs by mouth 3 (three) times daily between meals.         CONTINUE taking these medications         losartan-hydrochlorothiazide 100-25 MG per tablet   Commonly known as: HYZAAR      simvastatin 40 MG tablet   Commonly known as: ZOCOR          Where to get your medications    These are the prescriptions that you need to pick up.   You may get these medications from any pharmacy.         feeding supplement Liqd            Discharged Condition: good     Consults:pulmonary and thoracic surgery  Significant Diagnostic Studies: Dg Chest 2 View  06/24/2011  *RADIOLOGY REPORT*  Clinical Data: Shortness of breath, CHF  CHEST - 2 VIEW  Comparison: Dictated report from portable chest x-ray of 08/06/1998.  Findings: There is a left pneumothorax present of approximately 15- 20%.  Opacity is noted within the left upper lobe which may be chronic, but an active process cannot be excluded. Opacity at the left lung base may represent scarring or active process as well. The right lung is clear and somewhat hyperaerated.  Mediastinal contours are within normal limits.  The heart is normal in size. No acute bony abnormality is seen.  IMPRESSION:  1.  Approximately 20% left pneumothorax. 2.  Prominent markings in the left lung apex and at the left lung base may be chronic but an active process cannot be excluded.  I contacted Dr. Clarene Duke concerning this report at the time of dictation.  Original  Report Authenticated By: Juline Patch, M.D.   Ct Chest Wo Contrast  06/24/2011  *RADIOLOGY REPORT*  Clinical Data: Evaluate left pneumothorax.  Left upper back / flank pain.  Shortness of breath for 1 year.  Worsening lately.  CT CHEST WITHOUT CONTRAST  Technique:  Multidetector CT imaging of the chest was performed following the standard protocol without IV contrast.  Comparison: Plain film of earlier today.  No prior CT.  Findings: Lung windows demonstrate mild motion degradation inferiorly.  Scattered areas of scarring, including within the right lung base.  A small to moderate residual left-sided pneumothorax.  No chest tube identified.  The left lung is partially collapsed.  Within the left apex, there is probable bronchiectasis possible combined with minimal subpleural honeycombing.  Example images 15 - 17 of series 3. Series suboptimally evaluated secondary to the concurrent extent of volume loss/collapse. Similarly, within the left base, there is a suggestion of mild subpleural reticulation, including on image 49.  Linear scar or atelectasis in the left upper lobe on image 19.  No convincing evidence of fibrosis within the right lung.  No bronchopleural fistula.  Soft tissue windows demonstrate tortuous descending thoracic aorta. Normal heart size with a small left-sided pleural effusion.  No pericardial effusion.  Ascending right upper normal in size, 3.9 cm. No mediastinal or  definite hilar adenopathy, given limitations of unenhanced CT.  Limited abdominal imaging demonstrates remote granulomatous disease in the liver.  Motion degradation continuing into the upper abdomen. No acute osseous abnormality.  IMPRESSION:  1.  Given cross modality comparison, similar, approximately 20% left-sided hydropneumothorax. 2.  Partial collapse of the left lung.  Areas of left apical and basilar fibrosis.  Given absent fibrosis on the right, considerations would include post infectious/inflammatory fibrosis or an  atypical appearance of interstitial lung disease (i.e. usual interstitial pneumonitis or nonspecific interstitial pneumonitis. 3.  Mildly motion degraded exam.  Original Report Authenticated By: Consuello Bossier, M.D.   Dg Chest Port 1 View  06/26/2011  *RADIOLOGY REPORT*  Clinical Data: Pneumothorax, hypertension and pneumonia  PORTABLE CHEST - 1 VIEW  Comparison: June 24, 2009  Findings: A small left pneumothorax remains present but has decreased in size now less than 10%.  Interstitial fibrotic changes, left greater than right, remain present.  No gross pleural effusions are identified.  The cardiac silhouette, mediastinum, and pulmonary vasculature are within normal limits.  IMPRESSION: There is a persistent, less than 10%, left pneumothorax.  Original Report Authenticated By: Brandon Melnick, M.D.   Dg Chest Port 1 View  06/25/2011  *RADIOLOGY REPORT*  Clinical Data: Follow up left pneumothorax  PORTABLE CHEST - 1 VIEW  Comparison: 06/24/2011  Findings: Cardiomediastinal silhouette is stable. No pulmonary edema.  Stable small left upper pneumothorax.  Stable atelectasis in the left upper lobe and left base.  Question small left pleural effusion.  IMPRESSION: Stable small left pneumothorax.  Stable atelectasis in the left upper lobe and left base.  Question small left pleural effusion.  Original Report Authenticated By: Natasha Mead, M.D.   Dg Chest Port 1 View  06/24/2011  *RADIOLOGY REPORT*  Clinical Data: 78 year old male with pneumothorax.  PORTABLE CHEST - 1 VIEW  Comparison: Chest CT 1441 hours the same day and earlier.  Findings: AP portable seated upright view 1901 hours.  Small to moderate left pneumothorax is not significantly changed.  Pleural edge now primarily visible medially.  Stable patchy increased density in the left lung.  No right pneumothorax.  No pulmonary edema or significant effusion.  Stable cardiac size and mediastinal contours.  IMPRESSION: Stable small to moderate left  pneumothorax.  Original Report Authenticated By: Harley Hallmark, M.D.    Lab Results: No results found for this or any previous visit (from the past 48 hour(s)). Recent Results (from the past 240 hour(s))  URINE CULTURE     Status: Normal   Collection Time   06/24/11  8:40 AM      Component Value Range Status Comment   Specimen Description URINE, RANDOM   Final    Special Requests NONE   Final    Culture  Setup Time [id number]   Final    Colony Count NO GROWTH   Final    Culture NO GROWTH   Final    Report Status 06/25/2011 FINAL   Final      Hospital Course:  78 yo frail man admitted with minimal symptomatic spontaneous pneumothorax. Seen in the ED by Dr. Dorris Fetch - [facility] who did not recommend chest tube placement. Patient was admitted to medical floor and place on 100 NRB mask. He remained asymptomatic and chest pain free. Dr. Sherene Sires recommended DC home on oxygen with follow up in the pulmonary clinic .  Patient was also evaluated for FTT due to depression. He refused medications and started eating more.  Discharge  Exam: Blood pressure 128/67, pulse 76, temperature 96.7 F (35.9 C), temperature source Oral, resp. rate 18, height 5\' 7"  (1.702 m), weight 55.339 kg (122 lb), SpO2 100.00%. Alert and oriented x3 CVS: RRR RS: CTAB   Disposition: home  Discharge Orders    Future Orders Please Complete By Expires   Diet general      Increase activity slowly         Follow-up Information    Follow up with Geraldo Pitter, MD. Call in 1 week.      Follow up with Sandrea Hughs, MD. Schedule an appointment as soon as possible for a visit in 1 week.   Contact information:   [address] [facility] [address] [place] [place] [address] [phone]          Signed: Lonia Blood 06/29/2011, 7:09 AM

## 2011-06-30 ENCOUNTER — Telehealth: Payer: Self-pay | Admitting: Internal Medicine

## 2011-06-30 NOTE — Telephone Encounter (Signed)
Spoke with Verlon Au and okayed to schedule pt on 07-02-11 at 9:00.  Pt's sister aware of date and time of appt.

## 2011-07-02 ENCOUNTER — Encounter: Payer: Self-pay | Admitting: Internal Medicine

## 2011-07-02 ENCOUNTER — Ambulatory Visit (INDEPENDENT_AMBULATORY_CARE_PROVIDER_SITE_OTHER)
Admission: RE | Admit: 2011-07-02 | Discharge: 2011-07-02 | Disposition: A | Discharge status: Home / Self Care | Payer: Medicare Other | Source: Ambulatory Visit | Attending: Internal Medicine | Admitting: Internal Medicine

## 2011-07-02 ENCOUNTER — Ambulatory Visit (INDEPENDENT_AMBULATORY_CARE_PROVIDER_SITE_OTHER): Payer: Medicare Other | Admitting: Internal Medicine

## 2011-07-02 DIAGNOSIS — J939 Pneumothorax, unspecified: Secondary | ICD-10-CM

## 2011-07-02 DIAGNOSIS — J9383 Other pneumothorax: Secondary | ICD-10-CM

## 2011-07-02 NOTE — Patient Instructions (Signed)
Please remember to go to the  x-ray department downstairs for your tests - we will call you with the results when they are available.  Wear 02 as much as much as you can at 2lpm whether you think you need it or not for now  Please schedule a follow up office visit in 4 weeks, sooner if needed with cxr on return

## 2011-07-02 NOTE — Progress Notes (Signed)
Subjective:     Patient ID: Marcus Clark, male   DOB: 1915/06/28  MRN: [id number]  HPI  57 yobm quit smoking around 1970 and some sob chronically then develop L CP > dx ptx > rx with 02  Admit date: 06/24/2011  Discharge date: 06/29/2011  Discharge Diagnoses:  Principal Problem:  *FTT (failure to thrive) in adult  Active Problems:  Protein-calorie malnutrition, severe  HTN (hypertension)  Pneumothorax on left  Constipation  Dehydration   07/02/2011 f/u ov/[name] on 2lpm no trouble with breathing and no pain like he had with ptx. No cough  Sleeping ok without nocturnal  or early am exacerbation  of respiratory  c/o's or need for noct saba. Also denies any obvious fluctuation of symptoms with weather or environmental changes or other aggravating or alleviating factors except as outlined above   ROS  At present neg for  any significant sore throat, dysphagia, itching, sneezing,  nasal congestion or excess/ purulent secretions,  fever, chills, sweats, unintended wt loss, pleuritic or exertional cp, hempoptysis, orthopnea pnd or leg swelling.  Also denies presyncope, palpitations, heartburn, abdominal pain, nausea, vomiting, diarrhea  or change in bowel or urinary habits, dysuria,hematuria,  rash, arthralgias, visual complaints, headache, numbness weakness or ataxia.      Review of Systems     Objective:   Physical Exam    very frail thin elderly bm nad Wt 126 07/02/2011 HEENT mild turbinate edema.  Oropharynx no thrush or excess pnd or cobblestoning.  No JVD or cervical adenopathy. Mild accessory muscle hypertrophy. Trachea midline, nl thryroid. Chest was hyperinflated by percussion with diminished breath sounds and moderate increased exp time without wheeze. Hoover sign positive at mid inspiration. Regular rate and rhythm without murmur gallop or rub or increase P2 or edema.  Abd: no hsm, nl excursion. Ext warm without cyanosis or clubbing.   CXR  07/02/2011 :   No pneumothorax. Severe  COPD/chronic changes.    Assessment:         Plan:

## 2011-07-03 NOTE — Assessment & Plan Note (Addendum)
Resolved so ok to use 02 prn for now  Discussed limited options with daughter, hope to avoid any form of invasive surgery here given how frail he is.

## 2011-07-07 ENCOUNTER — Encounter: Payer: Self-pay | Admitting: *Deleted

## 2011-07-07 NOTE — Progress Notes (Signed)
Quick Note:  Letter mailed for the pt to call for these results. ______

## 2011-07-09 ENCOUNTER — Telehealth: Payer: Self-pay | Admitting: Internal Medicine

## 2011-07-09 NOTE — Telephone Encounter (Signed)
Called and spoke with both pt and daughter and informed them both of cxr results from 07/07/11 and MW's recs.  Both verbalized understanding and nothing further was needed.

## 2011-07-30 ENCOUNTER — Ambulatory Visit (INDEPENDENT_AMBULATORY_CARE_PROVIDER_SITE_OTHER): Payer: Medicare Other | Admitting: Internal Medicine

## 2011-07-30 ENCOUNTER — Ambulatory Visit (INDEPENDENT_AMBULATORY_CARE_PROVIDER_SITE_OTHER)
Admission: RE | Admit: 2011-07-30 | Discharge: 2011-07-30 | Disposition: A | Discharge status: Home / Self Care | Payer: Medicare Other | Source: Ambulatory Visit | Attending: Internal Medicine | Admitting: Internal Medicine

## 2011-07-30 ENCOUNTER — Encounter: Payer: Self-pay | Admitting: Internal Medicine

## 2011-07-30 VITALS — BP 138/62 | HR 86 | Temp 97.7°F | Ht 69.0 in | Wt 126.0 lb

## 2011-07-30 DIAGNOSIS — J939 Pneumothorax, unspecified: Secondary | ICD-10-CM

## 2011-07-30 DIAGNOSIS — J9383 Other pneumothorax: Secondary | ICD-10-CM

## 2011-07-30 DIAGNOSIS — J9600 Acute respiratory failure, unspecified whether with hypoxia or hypercapnia: Secondary | ICD-10-CM

## 2011-07-30 NOTE — Progress Notes (Signed)
Subjective:     Patient ID: Marcus Clark, male   DOB: 05/16/1915  MRN: [id number]  HPI  74 yobm quit smoking around 1970 and some sob chronically then develop L CP > dx ptx > rx with 02  Admit date: 06/24/2011  Discharge date: 06/29/2011  Discharge Diagnoses:  Principal Problem:  *FTT (failure to thrive) in adult  Active Problems:  Protein-calorie malnutrition, severe  HTN (hypertension)  Pneumothorax on left  Constipation  Dehydration   2/27/2013hosp  f/u ov/Meilani Edmundson on 2lpm no trouble with breathing and no pain like he had with ptx. No cough rec Wear 02 as much as much as you can at 2lpm whether you think you need it or not for now    07/30/2011 f/u ov/Sims Laday cc f/u ptx, no longer using 02 at all and quite sedentary but not limited by breathing. No cough or overt sinus symptoms  Sleeping ok without nocturnal  or early am exacerbation  of respiratory  c/o's or need for noct saba. Also denies any obvious fluctuation of symptoms with weather or environmental changes or other aggravating or alleviating factors except as outlined above   ROS  At present neg for  any significant sore throat, dysphagia, itching, sneezing,  nasal congestion or excess/ purulent secretions,  fever, chills, sweats, unintended wt loss, pleuritic or exertional cp, hempoptysis, orthopnea pnd or leg swelling.  Also denies presyncope, palpitations, heartburn, abdominal pain, nausea, vomiting, diarrhea  or change in bowel or urinary habits, dysuria,hematuria,  rash, arthralgias, visual complaints, headache, numbness weakness or ataxia.              Objective:   Physical Exam    very frail thin elderly bm nad quite stoic, lets his fm do the talking Wt 126 07/02/2011 > 07/30/2011  126 HEENT mild turbinate edema.  Oropharynx no thrush or excess pnd or cobblestoning.  No JVD or cervical adenopathy. Mild accessory muscle hypertrophy. Trachea midline, nl thryroid. Chest was hyperinflated by percussion with diminished  breath sounds and moderate increased exp time without wheeze. Hoover sign positive at mid inspiration. Regular rate and rhythm without murmur gallop or rub or increase P2 or edema.  Abd: no hsm, nl excursion. Ext warm without cyanosis or clubbing.   CXR  07/02/2011 :   No pneumothorax. Severe COPD/chronic changes.    Assessment:         Plan:

## 2011-07-30 NOTE — Patient Instructions (Signed)
We will cancel your 02 effective today.  Follow up can be as needed

## 2011-08-01 DIAGNOSIS — J9600 Acute respiratory failure, unspecified whether with hypoxia or hypercapnia: Secondary | ICD-10-CM | POA: Insufficient documentation

## 2011-08-01 NOTE — Assessment & Plan Note (Signed)
He is saturating well at rest and did drop with one lap x 185 ft but this oximeter did not correlate to pulse so suspect it's fictitious as he looked great at the end of one lap and is not this active at home.  Ok therefore to d/c 02 at this point.

## 2011-08-01 NOTE — Assessment & Plan Note (Signed)
No recurrence by cxr today, no need for further f/u

## 2013-03-24 DIAGNOSIS — R634 Abnormal weight loss: Secondary | ICD-10-CM | POA: Diagnosis not present

## 2013-03-24 DIAGNOSIS — M1990 Unspecified osteoarthritis, unspecified site: Secondary | ICD-10-CM | POA: Diagnosis not present

## 2013-04-30 ENCOUNTER — Emergency Department (HOSPITAL_COMMUNITY): Payer: Medicare Other

## 2013-04-30 ENCOUNTER — Encounter (HOSPITAL_COMMUNITY): Payer: Self-pay | Admitting: Emergency Medicine

## 2013-04-30 ENCOUNTER — Inpatient Hospital Stay (HOSPITAL_COMMUNITY)
Admission: EM | Admit: 2013-04-30 | Discharge: 2013-05-04 | DRG: 727 | Disposition: A | Discharge status: Home / Self Care | Payer: Medicare Other | Attending: Internal Medicine | Admitting: Internal Medicine

## 2013-04-30 DIAGNOSIS — K529 Noninfective gastroenteritis and colitis, unspecified: Secondary | ICD-10-CM

## 2013-04-30 DIAGNOSIS — J9600 Acute respiratory failure, unspecified whether with hypoxia or hypercapnia: Secondary | ICD-10-CM

## 2013-04-30 DIAGNOSIS — E872 Acidosis, unspecified: Secondary | ICD-10-CM | POA: Diagnosis present

## 2013-04-30 DIAGNOSIS — Z87891 Personal history of nicotine dependence: Secondary | ICD-10-CM | POA: Diagnosis not present

## 2013-04-30 DIAGNOSIS — K5900 Constipation, unspecified: Secondary | ICD-10-CM | POA: Diagnosis present

## 2013-04-30 DIAGNOSIS — R112 Nausea with vomiting, unspecified: Secondary | ICD-10-CM | POA: Diagnosis not present

## 2013-04-30 DIAGNOSIS — D72829 Elevated white blood cell count, unspecified: Secondary | ICD-10-CM | POA: Diagnosis present

## 2013-04-30 DIAGNOSIS — K6289 Other specified diseases of anus and rectum: Secondary | ICD-10-CM | POA: Diagnosis not present

## 2013-04-30 DIAGNOSIS — M79605 Pain in left leg: Secondary | ICD-10-CM

## 2013-04-30 DIAGNOSIS — K5289 Other specified noninfective gastroenteritis and colitis: Secondary | ICD-10-CM | POA: Diagnosis not present

## 2013-04-30 DIAGNOSIS — I509 Heart failure, unspecified: Secondary | ICD-10-CM | POA: Diagnosis present

## 2013-04-30 DIAGNOSIS — N400 Enlarged prostate without lower urinary tract symptoms: Secondary | ICD-10-CM | POA: Diagnosis present

## 2013-04-30 DIAGNOSIS — R197 Diarrhea, unspecified: Secondary | ICD-10-CM

## 2013-04-30 DIAGNOSIS — E785 Hyperlipidemia, unspecified: Secondary | ICD-10-CM | POA: Diagnosis present

## 2013-04-30 DIAGNOSIS — I10 Essential (primary) hypertension: Secondary | ICD-10-CM | POA: Diagnosis not present

## 2013-04-30 DIAGNOSIS — N419 Inflammatory disease of prostate, unspecified: Principal | ICD-10-CM | POA: Diagnosis present

## 2013-04-30 DIAGNOSIS — E43 Unspecified severe protein-calorie malnutrition: Secondary | ICD-10-CM | POA: Diagnosis present

## 2013-04-30 DIAGNOSIS — Z66 Do not resuscitate: Secondary | ICD-10-CM | POA: Diagnosis present

## 2013-04-30 DIAGNOSIS — J939 Pneumothorax, unspecified: Secondary | ICD-10-CM

## 2013-04-30 DIAGNOSIS — R109 Unspecified abdominal pain: Secondary | ICD-10-CM | POA: Diagnosis not present

## 2013-04-30 DIAGNOSIS — Z79899 Other long term (current) drug therapy: Secondary | ICD-10-CM | POA: Diagnosis not present

## 2013-04-30 DIAGNOSIS — R627 Adult failure to thrive: Secondary | ICD-10-CM | POA: Diagnosis present

## 2013-04-30 DIAGNOSIS — E860 Dehydration: Secondary | ICD-10-CM | POA: Diagnosis present

## 2013-04-30 DIAGNOSIS — M79609 Pain in unspecified limb: Secondary | ICD-10-CM | POA: Diagnosis not present

## 2013-04-30 DIAGNOSIS — Z681 Body mass index (BMI) 19 or less, adult: Secondary | ICD-10-CM

## 2013-04-30 DIAGNOSIS — R1032 Left lower quadrant pain: Secondary | ICD-10-CM | POA: Diagnosis present

## 2013-04-30 LAB — CBC WITH DIFFERENTIAL/PLATELET
Eosinophils Relative: 0 % (ref 0–5)
HCT: 26.1 % — ABNORMAL LOW (ref 39.0–52.0)
Hemoglobin: 9 g/dL — ABNORMAL LOW (ref 13.0–17.0)
Lymphocytes Relative: 4 % — ABNORMAL LOW (ref 12–46)
MCV: 91.6 fL (ref 78.0–100.0)
Monocytes Absolute: 1.9 10*3/uL — ABNORMAL HIGH (ref 0.1–1.0)
Monocytes Relative: 11 % (ref 3–12)
Neutro Abs: 14 10*3/uL — ABNORMAL HIGH (ref 1.7–7.7)
WBC: 16.5 10*3/uL — ABNORMAL HIGH (ref 4.0–10.5)

## 2013-04-30 LAB — URINALYSIS W MICROSCOPIC + REFLEX CULTURE
Leukocytes, UA: NEGATIVE
Nitrite: NEGATIVE
Protein, ur: NEGATIVE mg/dL
Urobilinogen, UA: 0.2 mg/dL (ref 0.0–1.0)

## 2013-04-30 LAB — COMPREHENSIVE METABOLIC PANEL
BUN: 25 mg/dL — ABNORMAL HIGH (ref 6–23)
CO2: 27 mEq/L (ref 19–32)
Calcium: 8.7 mg/dL (ref 8.4–10.5)
Chloride: 99 mEq/L (ref 96–112)
Creatinine, Ser: 1.16 mg/dL (ref 0.50–1.35)
GFR calc Af Amer: 60 mL/min — ABNORMAL LOW (ref >90)
GFR calc non Af Amer: 52 mL/min — ABNORMAL LOW (ref >90)
Glucose, Bld: 172 mg/dL — ABNORMAL HIGH (ref 70–99)
Total Bilirubin: 0.5 mg/dL (ref 0.3–1.2)

## 2013-04-30 LAB — LACTIC ACID, PLASMA: Lactic Acid, Venous: 2.3 mmol/L — ABNORMAL HIGH (ref 0.5–2.2)

## 2013-04-30 LAB — MAGNESIUM: Magnesium: 1.7 mg/dL (ref 1.5–2.5)

## 2013-04-30 MED ORDER — SODIUM CHLORIDE 0.9 % IV SOLN
INTRAVENOUS | Status: DC
  Administered 2013-04-30: 75 mL/h via INTRAVENOUS
  Administered 2013-04-30 – 2013-05-03 (×4): via INTRAVENOUS

## 2013-04-30 MED ORDER — METRONIDAZOLE IN NACL 5-0.79 MG/ML-% IV SOLN
500.0000 mg | Freq: Three times a day (TID) | INTRAVENOUS | Status: DC
  Administered 2013-05-01 – 2013-05-04 (×11): 500 mg via INTRAVENOUS
  Filled 2013-04-30 (×13): qty 100

## 2013-04-30 MED ORDER — CIPROFLOXACIN IN D5W 400 MG/200ML IV SOLN
400.0000 mg | Freq: Once | INTRAVENOUS | Status: AC
  Administered 2013-04-30: 400 mg via INTRAVENOUS
  Filled 2013-04-30: qty 200

## 2013-04-30 MED ORDER — AMLODIPINE BESYLATE 5 MG PO TABS
5.0000 mg | ORAL_TABLET | Freq: Every day | ORAL | Status: DC
  Administered 2013-05-01 – 2013-05-04 (×4): 5 mg via ORAL
  Filled 2013-04-30 (×5): qty 1

## 2013-04-30 MED ORDER — ONDANSETRON HCL 4 MG/2ML IJ SOLN
4.0000 mg | Freq: Four times a day (QID) | INTRAMUSCULAR | Status: DC | PRN
  Administered 2013-05-01: 4 mg via INTRAVENOUS
  Filled 2013-04-30: qty 2

## 2013-04-30 MED ORDER — HEPARIN SODIUM (PORCINE) 5000 UNIT/ML IJ SOLN
5000.0000 [IU] | Freq: Three times a day (TID) | INTRAMUSCULAR | Status: DC
  Administered 2013-04-30 – 2013-05-04 (×11): 5000 [IU] via SUBCUTANEOUS
  Filled 2013-04-30 (×13): qty 1

## 2013-04-30 MED ORDER — ACETAMINOPHEN 650 MG RE SUPP: 650.0000 mg | Freq: Four times a day (QID) | RECTAL | Status: DC | PRN

## 2013-04-30 MED ORDER — SIMVASTATIN 40 MG PO TABS
40.0000 mg | ORAL_TABLET | Freq: Every morning | ORAL | Status: DC
  Filled 2013-04-30: qty 1

## 2013-04-30 MED ORDER — SODIUM CHLORIDE 0.9 % IV SOLN
INTRAVENOUS | Status: AC
  Administered 2013-04-30: 23:00:00 via INTRAVENOUS

## 2013-04-30 MED ORDER — TAMSULOSIN HCL 0.4 MG PO CAPS
0.4000 mg | ORAL_CAPSULE | Freq: Every day | ORAL | Status: DC
  Administered 2013-04-30 – 2013-05-03 (×4): 0.4 mg via ORAL
  Filled 2013-04-30 (×5): qty 1

## 2013-04-30 MED ORDER — ONDANSETRON HCL 4 MG PO TABS: 4.0000 mg | ORAL_TABLET | Freq: Four times a day (QID) | ORAL | Status: DC | PRN

## 2013-04-30 MED ORDER — CIPROFLOXACIN IN D5W 400 MG/200ML IV SOLN
400.0000 mg | Freq: Two times a day (BID) | INTRAVENOUS | Status: DC
  Administered 2013-05-01 – 2013-05-04 (×7): 400 mg via INTRAVENOUS
  Filled 2013-04-30 (×9): qty 200

## 2013-04-30 MED ORDER — ONDANSETRON HCL 4 MG/2ML IJ SOLN
4.0000 mg | INTRAMUSCULAR | Status: AC | PRN
  Administered 2013-04-30 (×2): 4 mg via INTRAVENOUS
  Filled 2013-04-30 (×2): qty 2

## 2013-04-30 MED ORDER — ENSURE COMPLETE PO LIQD
237.0000 mL | Freq: Three times a day (TID) | ORAL | Status: DC
  Administered 2013-05-01 – 2013-05-04 (×2): 237 mL via ORAL
  Filled 2013-04-30 (×12): qty 237

## 2013-04-30 MED ORDER — ACETAMINOPHEN 325 MG PO TABS: 650.0000 mg | ORAL_TABLET | Freq: Four times a day (QID) | ORAL | Status: DC | PRN

## 2013-04-30 MED ORDER — METRONIDAZOLE IN NACL 5-0.79 MG/ML-% IV SOLN
500.0000 mg | Freq: Once | INTRAVENOUS | Status: AC
  Administered 2013-04-30: 500 mg via INTRAVENOUS
  Filled 2013-04-30: qty 100

## 2013-04-30 NOTE — ED Notes (Signed)
Per pt family pt woke up this am and wasn't feeling well. sts couldn't have a BM and took a laxative. sts then he drank a boost and vomited. Pt now having diarrhea.

## 2013-04-30 NOTE — ED Notes (Signed)
MD at bedside. 

## 2013-04-30 NOTE — H&P (Signed)
[facility] History and Physical  Marcus Clark XBJ:[id number] DOB: [birth year] DOA: 04/30/2013  Referring physician: Dr. Clarene Duke PCP: Geraldo Pitter, MD   Chief Complaint: Abdominal pain, nausea, vomiting, diarrhea  HPI: Marcus Clark is a 79 y.o. male with a past medical history significant for hypertension, hyperlipidemia, failure to thrive and severe protein calorie malnutrition; came to the hospital secondary to gradual onset of multiple intermittent episode of nausea, vomiting and diarrhea. Patient described that he has been for the last 2-3 days complaining of severe constipation that requires him to use some Dulcolax suppository and subsequent nonstop diarrhea happened after this intervention. Per family members patient has not been eating or drinking properly for the last 3 days or so, complaining of abd pain and has been weaker than usual. Patient itself denies any chest pain, shortness of breath, fevers, chills, hematochezia, melena or hematemesis. In the ED patient has been found with leukocytosis (WBCs in the 16,000 range), slight elevation of his lactic acid, dehydrated on physical exam and with a CT scan of the abdomen and pelvis demonstrating colitis/proctitis and prostate enlargement with presumed prostatitis. [facility] has been called to admit the patient for further evaluation and treatment.  Review of Systems:  Negative except as otherwise mentioned on history of present illness.  Past Medical History  Diagnosis Date  . Hypertension   . CHF (congestive heart failure)   . Shortness of breath    Past Surgical History  Procedure Laterality Date  . Bladder surgery      had tumor removed 20 yrs ago   Social History:  reports that he quit smoking about 42 years ago. His smoking use included Cigarettes. He has a 7.5 pack-year smoking history. He has never used smokeless tobacco. He reports that he does not drink alcohol or use illicit drugs.  Allergies  Allergen  Reactions  . Contrast Media [Iodinated Diagnostic Agents] Hives   Family history: Positive for hypertension. At this point given patient's age not contributory  Prior to Admission medications   Medication Sig Start Date End Date Taking? Authorizing Provider  feeding supplement (ENSURE CLINICAL STRENGTH) LIQD Take 237 mLs by mouth 3 (three) times daily between meals. 06/26/11  Yes Sorin Luanne Bras, MD  losartan-hydrochlorothiazide (HYZAAR) 100-25 MG per tablet Take 1 tablet by mouth daily.   Yes Historical Provider, MD  simvastatin (ZOCOR) 40 MG tablet Take 40 mg by mouth every morning.   Yes Historical Provider, MD   Physical Exam: Filed Vitals:   04/30/13 1600  BP: 122/63  Pulse: 80  Temp:   Resp: 24    BP 122/63  Pulse 80  Temp(Src) 98 F (36.7 C) (Oral)  Resp 24  Wt 56.246 kg (124 lb)  SpO2 100%  General:  Cachectic, frail elderly man who appears calm and comfortable; complaining of nausea but no further vomiting since arrival to ED. Patient is afebrile Eyes: PERRL, normal lids, irises & conjunctiva ENT: Dry mucous membranes, dentures in place, no erythema or exudates inside his mouth; no drainage out of his ears or nostrils Neck: no LAD, masses or thyromegaly Cardiovascular: RRR, positive systolic ejection murmur, no rubs or gallops. No LE edema. Telemetry: SR, no arrhythmias  Respiratory: CTA bilaterally, no w/r/r. Normal respiratory effort. Abdomen: soft, positive left lower quadrant discomfort on palpation, no distention, positive bowel sounds Skin: no rash or induration seen on skin examination  Musculoskeletal: grossly normal tone BUE/BLE Neurologic: grossly non-focal; patient able to follow commands, moving 4 limbs spontaneously and without  any focal motor or sensory deficit appreciated.           Labs on Admission:  Basic Metabolic Panel:  Recent Labs Lab 04/30/13 1119  NA 135  K 4.1  CL 99  CO2 27  GLUCOSE 172*  BUN 25*  CREATININE 1.16  CALCIUM 8.7    Liver Function Tests:  Recent Labs Lab 04/30/13 1119  AST 20  ALT 20  ALKPHOS 48  BILITOT 0.5  PROT 7.0  ALBUMIN 3.0*    Recent Labs Lab 04/30/13 1150  LIPASE 20   CBC:  Recent Labs Lab 04/30/13 1119  WBC 16.5*  NEUTROABS 14.0*  HGB 9.0*  HCT 26.1*  MCV 91.6  PLT 289   Cardiac Enzymes:  Recent Labs Lab 04/30/13 1150  TROPONINI <0.30    Radiological Exams on Admission: Ct Abdomen Pelvis Wo Contrast  04/30/2013   CLINICAL DATA:  Vomiting  EXAM: CT ABDOMEN AND PELVIS WITHOUT CONTRAST  TECHNIQUE: Multidetector CT imaging of the abdomen and pelvis was performed following the standard protocol without IV contrast.  COMPARISON:  None.  FINDINGS: Chronic changes at the lung bases.  The calcified granuloma in the liver.  Gallbladder, spleen, pancreas, adrenal glands, and kidneys are grossly within normal limits allowing for motion artifact.  Diffuse bladder wall thickening. Prostate is mildly enlarged. It measures 5.0 cm in diameter.  There is circumferential wall thickening of the colon extending from the rectosigmoid junction to the anus. There is no evidence of colonic obstruction.  Minor degenerative changes in the lumbar spine. No vertebral compression.  Atherosclerotic vascular calcifications  Normal appendix.  IMPRESSION: There is wall thickening of the rectum. An inflammatory process or underlying malignancy are not excluded.  Prostate enlargement.  Correlate with PSA and physical exam.  Wall thickening of the bladder.  Correlate with urinalysis.   Electronically Signed   By: Maryclare Bean M.D.   On: 04/30/2013 15:29   Dg Abd Acute W/chest  04/30/2013   CLINICAL DATA:  Emesis and diarrhea.  EXAM: ACUTE ABDOMEN SERIES (ABDOMEN 2 VIEW & CHEST 1 VIEW)  COMPARISON:  Chest radiographs 07/30/2011  FINDINGS: The cardiomediastinal silhouette is within normal limits. The lungs remain hyperinflated with similar appearance of irregular parenchymal density and pleural thickening  involving the left upper lobe and left lung apex. Mild linear density in the left lung base also appears unchanged and is suggestive of scarring. There is no definite evidence of new airspace consolidation, edema, pleural effusion, or pneumothorax.  There is no evidence of intraperitoneal free air. No air-fluid levels are identified. Gas is present in loops of small and large bowel without evidence of obstruction. There are a few mildly prominent loops of bowel in the left mid and lower abdomen with suggestion of mild wall thickening or irregularity. No acute osseous abnormality is identified.  IMPRESSION: 1. Stable appearance of the lungs without evidence of acute airspace disease. 2. No evidence of bowel obstruction. A few mildly prominent loops of bowel are present in the left hemi abdomen, query enteritis.   Electronically Signed   By: Sebastian Ache   On: 04/30/2013 12:48    Assessment/Plan 1-abd pain, nausea, vomiting and diarrhea: Appears to be secondary to proctitis/prostatitis, basal CT abdomen and pelvis results. -Will start patient on Cipro and Flagyl -Clear liquid diet -Supportive care and fluid resuscitation -Follow clinical response -will check Gi pathogen panel -prn antiemetics  2-Protein-calorie malnutrition, severe: -will start ensure and encourage PO intake  3-Dehydration: will provide IVF's resuscitation and  hold diuretics  4-leukocytosis and lactic acidosis: due to proctitis/prostatis and dehydration. -follow wbc's trend -provide IVF's and treat infection  5-left lower quadrant pain: due to colitis/proctitis. -continue bowel rest with CLD -continue IV antibiotics  6-BPH (benign prostatic hyperplasia)/with concerns for prostatitis . -will continue IV antibiotics for now -patient w/o complaints of dysuria and unremarkable UA. -will start flomax.  DVT: heparin.  Code Status: DNR Family Communication: son at bedside Disposition Plan: LOS > 2 midnights, inpatient,  med-surg  Time spent: 45 minutes  Marcus Clark [facility] Pager [phone]

## 2013-04-30 NOTE — ED Notes (Signed)
Patient transported to CT 

## 2013-04-30 NOTE — ED Notes (Signed)
Per pharmacy, zofran is compatible with ciprofloxacin

## 2013-04-30 NOTE — ED Provider Notes (Signed)
CSN: [id number]     Arrival date & time 04/30/13  1059 History   First MD Initiated Contact with Patient 04/30/13 1131     Chief Complaint  Patient presents with  . Emesis  . Diarrhea    HPI Pt was seen at 1145. Per pt and his family, c/o gradual onset and persistence of multiple intermittent episodes of N/V/D that began this morning.   Describes the stools as "watery."  States he "though I was constipated" so he took a laxative by rectum this morning. Pt's symptoms began shortly after that.  Pt has been unable to tol PO due to N/V, per family at bedside. Denies abd pain, no CP/SOB, no back pain, no fevers, no black or blood in stools or emesis.     Past Medical History  Diagnosis Date  . Hypertension   . CHF (congestive heart failure)   . Shortness of breath    Past Surgical History  Procedure Laterality Date  . Bladder surgery      had tumor removed 20 yrs ago    History  Substance Use Topics  . Smoking status: Former Smoker -- 0.30 packs/day for 25 years    Types: Cigarettes    Quit date: 05/06/1971  . Smokeless tobacco: Never Used  . Alcohol Use: No    Review of Systems ROS: Statement: All systems negative except as marked or noted in the HPI; Constitutional: Negative for fever and chills. ; ; Eyes: Negative for eye pain, redness and discharge. ; ; ENMT: Negative for ear pain, hoarseness, nasal congestion, sinus pressure and sore throat. ; ; Cardiovascular: Negative for chest pain, palpitations, diaphoresis, dyspnea and peripheral edema. ; ; Respiratory: Negative for cough, wheezing and stridor. ; ; Gastrointestinal: +N/V/D. Negative for abdominal pain, blood in stool, hematemesis, jaundice and rectal bleeding. . ; ; Genitourinary: Negative for dysuria, flank pain and hematuria. ; ; Musculoskeletal: Negative for back pain and neck pain. Negative for swelling and trauma.; ; Skin: Negative for pruritus, rash, abrasions, blisters, bruising and skin lesion.; ; Neuro: Negative for  headache, lightheadedness and neck stiffness. Negative for weakness, altered level of consciousness , altered mental status, extremity weakness, paresthesias, involuntary movement, seizure and syncope.      Allergies  Contrast media  Home Medications   Current Outpatient Rx  Name  Route  Sig  Dispense  Refill  . feeding supplement (ENSURE CLINICAL STRENGTH) LIQD   Oral   Take 237 mLs by mouth 3 (three) times daily between meals.   90 Bottle   0   . losartan-hydrochlorothiazide (HYZAAR) 100-25 MG per tablet   Oral   Take 1 tablet by mouth daily.         . simvastatin (ZOCOR) 40 MG tablet   Oral   Take 40 mg by mouth every morning.          BP 122/63  Pulse 80  Temp(Src) 98 F (36.7 C) (Oral)  Resp 24  Wt 124 lb (56.246 kg)  SpO2 100% Physical Exam 1150: Physical examination:  Nursing notes reviewed; Vital signs and O2 SAT reviewed;  Constitutional: Thin, In no acute distress; Head:  Normocephalic, atraumatic; Eyes: EOMI, PERRL, No scleral icterus; ENMT: Mouth and pharynx normal, Mucous membranes dry; Neck: Supple, Full range of motion, No lymphadenopathy; Cardiovascular: Regular rate and rhythm, No gallop; Respiratory: Breath sounds clear & equal bilaterally, No wheezes.  Speaking full sentences with ease, Normal respiratory effort/excursion; Chest: Nontender, Movement normal; Abdomen: Soft, +LLQ tenderness to palp. No  rebound or guarding. Nondistended, Normal bowel sounds; Genitourinary: No CVA tenderness; Extremities: Pulses normal, No tenderness, No edema, No calf edema or asymmetry.; Neuro: AA&Ox3, vague historian. Major CN grossly intact.  Speech clear. No gross focal motor or sensory deficits in extremities.; Skin: Color normal, Warm, Dry.   ED Course  Procedures   EKG Interpretation    Date/Time:  Saturday April 30 2013 12:04:23 EST Ventricular Rate:  88 PR Interval:  163 QRS Duration: 93 QT Interval:  395 QTC Calculation: 478 R Axis:   85 Text  Interpretation:  Sinus rhythm Baseline wander Left ventricular hypertrophy When compared with ECG of 06/24/2011 No significant change was found Confirmed by [facility]  MD, Nicholos Johns [phone]) on 04/30/2013 1:04:31 PM            MDM  MDM Reviewed: previous chart, nursing note and vitals Reviewed previous: labs and ECG Interpretation: labs, ECG, x-ray and CT scan     Results for orders placed during the hospital encounter of 04/30/13  CBC WITH DIFFERENTIAL      Result Value Range   WBC 16.5 (*) 4.0 - 10.5 K/uL   RBC 2.85 (*) 4.22 - 5.81 MIL/uL   Hemoglobin 9.0 (*) 13.0 - 17.0 g/dL   HCT 11.9 (*) 14.7 - 82.9 %   MCV 91.6  78.0 - 100.0 fL   MCH 31.6  26.0 - 34.0 pg   MCHC 34.5  30.0 - 36.0 g/dL   RDW 56.2  13.0 - 86.5 %   Platelets 289  150 - 400 K/uL   Neutrophils Relative % 85 (*) 43 - 77 %   Neutro Abs 14.0 (*) 1.7 - 7.7 K/uL   Lymphocytes Relative 4 (*) 12 - 46 %   Lymphs Abs 0.6 (*) 0.7 - 4.0 K/uL   Monocytes Relative 11  3 - 12 %   Monocytes Absolute 1.9 (*) 0.1 - 1.0 K/uL   Eosinophils Relative 0  0 - 5 %   Eosinophils Absolute 0.0  0.0 - 0.7 K/uL   Basophils Relative 0  0 - 1 %   Basophils Absolute 0.0  0.0 - 0.1 K/uL  COMPREHENSIVE METABOLIC PANEL      Result Value Range   Sodium 135  135 - 145 mEq/L   Potassium 4.1  3.5 - 5.1 mEq/L   Chloride 99  96 - 112 mEq/L   CO2 27  19 - 32 mEq/L   Glucose, Bld 172 (*) 70 - 99 mg/dL   BUN 25 (*) 6 - 23 mg/dL   Creatinine, Ser 7.84  0.50 - 1.35 mg/dL   Calcium 8.7  8.4 - 69.6 mg/dL   Total Protein 7.0  6.0 - 8.3 g/dL   Albumin 3.0 (*) 3.5 - 5.2 g/dL   AST 20  0 - 37 U/L   ALT 20  0 - 53 U/L   Alkaline Phosphatase 48  39 - 117 U/L   Total Bilirubin 0.5  0.3 - 1.2 mg/dL   GFR calc non Af Amer 52 (*) >90 mL/min   GFR calc Af Amer 60 (*) >90 mL/min  URINALYSIS W MICROSCOPIC + REFLEX CULTURE      Result Value Range   Color, Urine YELLOW  YELLOW   APPearance CLEAR  CLEAR   Specific Gravity, Urine 1.018  1.005 - 1.030   pH  7.0  5.0 - 8.0   Glucose, UA NEGATIVE  NEGATIVE mg/dL   Hgb urine dipstick TRACE (*) NEGATIVE   Bilirubin Urine NEGATIVE  NEGATIVE  Ketones, ur NEGATIVE  NEGATIVE mg/dL   Protein, ur NEGATIVE  NEGATIVE mg/dL   Urobilinogen, UA 0.2  0.0 - 1.0 mg/dL   Nitrite NEGATIVE  NEGATIVE   Leukocytes, UA NEGATIVE  NEGATIVE   WBC, UA 0-2  <3 WBC/hpf   RBC / HPF 3-6  <3 RBC/hpf  LIPASE, BLOOD      Result Value Range   Lipase 20  11 - 59 U/L  TROPONIN I      Result Value Range   Troponin I <0.30  <0.30 ng/mL  LACTIC ACID, PLASMA      Result Value Range   Lactic Acid, Venous 2.3 (*) 0.5 - 2.2 mmol/L   Ct Abdomen Pelvis Wo Contrast 04/30/2013   CLINICAL DATA:  Vomiting  EXAM: CT ABDOMEN AND PELVIS WITHOUT CONTRAST  TECHNIQUE: Multidetector CT imaging of the abdomen and pelvis was performed following the standard protocol without IV contrast.  COMPARISON:  None.  FINDINGS: Chronic changes at the lung bases.  The calcified granuloma in the liver.  Gallbladder, spleen, pancreas, adrenal glands, and kidneys are grossly within normal limits allowing for motion artifact.  Diffuse bladder wall thickening. Prostate is mildly enlarged. It measures 5.0 cm in diameter.  There is circumferential wall thickening of the colon extending from the rectosigmoid junction to the anus. There is no evidence of colonic obstruction.  Minor degenerative changes in the lumbar spine. No vertebral compression.  Atherosclerotic vascular calcifications  Normal appendix.  IMPRESSION: There is wall thickening of the rectum. An inflammatory process or underlying malignancy are not excluded.  Prostate enlargement.  Correlate with PSA and physical exam.  Wall thickening of the bladder.  Correlate with urinalysis.   Electronically Signed   By: Maryclare Bean M.D.   On: 04/30/2013 15:29   Dg Abd Acute W/chest 04/30/2013   CLINICAL DATA:  Emesis and diarrhea.  EXAM: ACUTE ABDOMEN SERIES (ABDOMEN 2 VIEW & CHEST 1 VIEW)  COMPARISON:  Chest radiographs  07/30/2011  FINDINGS: The cardiomediastinal silhouette is within normal limits. The lungs remain hyperinflated with similar appearance of irregular parenchymal density and pleural thickening involving the left upper lobe and left lung apex. Mild linear density in the left lung base also appears unchanged and is suggestive of scarring. There is no definite evidence of new airspace consolidation, edema, pleural effusion, or pneumothorax.  There is no evidence of intraperitoneal free air. No air-fluid levels are identified. Gas is present in loops of small and large bowel without evidence of obstruction. There are a few mildly prominent loops of bowel in the left mid and lower abdomen with suggestion of mild wall thickening or irregularity. No acute osseous abnormality is identified.  IMPRESSION: 1. Stable appearance of the lungs without evidence of acute airspace disease. 2. No evidence of bowel obstruction. A few mildly prominent loops of bowel are present in the left hemi abdomen, query enteritis.   Electronically Signed   By: Sebastian Ache   On: 04/30/2013 12:48   Results for SYE, SCHROEPFER (MRN [id number]) as of 04/30/2013 16:36  Ref. Range 06/24/2011 08:16 06/24/2011 13:44 06/25/2011 04:50 04/30/2013 11:19  Hemoglobin Latest Range: 13.0-17.0 g/dL 78.2 (L) 95.6 (L) 9.2 (L) 9.0 (L)  HCT Latest Range: 39.0-52.0 % 32.6 (L) 29.2 (L) 27.3 (L) 26.1 (L)    1550:  H/H per baseline. WBC count elevated and colitis seen on CT scan; will start IV cipro/flagyl. Dx and testing d/w pt and family.  Questions answered.  Verb understanding, agreeable to admit. T/C to  [facility] Dr. Gwenlyn Perking, case discussed, including:  HPI, pertinent PM/SHx, VS/PE, dx testing, ED course and treatment:  Agreeable to admit, requests to write temporary orders, obtain inpatient bed to team 10.   Laray Anger, DO 05/04/13 1234

## 2013-05-01 DIAGNOSIS — R197 Diarrhea, unspecified: Secondary | ICD-10-CM | POA: Diagnosis not present

## 2013-05-01 DIAGNOSIS — M79609 Pain in unspecified limb: Secondary | ICD-10-CM | POA: Diagnosis not present

## 2013-05-01 DIAGNOSIS — I10 Essential (primary) hypertension: Secondary | ICD-10-CM | POA: Diagnosis not present

## 2013-05-01 DIAGNOSIS — K6289 Other specified diseases of anus and rectum: Secondary | ICD-10-CM | POA: Diagnosis not present

## 2013-05-01 LAB — CBC
Hemoglobin: 7.4 g/dL — ABNORMAL LOW (ref 13.0–17.0)
MCHC: 33 g/dL (ref 30.0–36.0)
RBC: 2.45 MIL/uL — ABNORMAL LOW (ref 4.22–5.81)
WBC: 9.7 10*3/uL (ref 4.0–10.5)

## 2013-05-01 LAB — BASIC METABOLIC PANEL
CO2: 25 mEq/L (ref 19–32)
Calcium: 7.9 mg/dL — ABNORMAL LOW (ref 8.4–10.5)
Chloride: 100 mEq/L (ref 96–112)
GFR calc Af Amer: 61 mL/min — ABNORMAL LOW (ref >90)
Glucose, Bld: 80 mg/dL (ref 70–99)
Potassium: 3.7 mEq/L (ref 3.5–5.1)
Sodium: 134 mEq/L — ABNORMAL LOW (ref 135–145)

## 2013-05-01 MED ORDER — ATORVASTATIN CALCIUM 20 MG PO TABS
20.0000 mg | ORAL_TABLET | Freq: Every day | ORAL | Status: DC
  Administered 2013-05-01 – 2013-05-04 (×4): 20 mg via ORAL
  Filled 2013-05-01 (×4): qty 1

## 2013-05-01 NOTE — Evaluation (Signed)
Physical Therapy Evaluation Patient Details Name: Marcus Clark MRN: [id number] DOB: December 23, 1915 Today's Date: 05/01/2013 Time: 4696-2952 PT Time Calculation (min): 21 min  PT Assessment / Plan / Recommendation History of Present Illness  Marcus Clark is a 79 y.o. male with a past medical history significant for hypertension, hyperlipidemia, failure to thrive and severe protein calorie malnutrition; came to the hospital secondary to gradual onset of multiple intermittent episode of nausea, vomiting and diarrhea.   Clinical Impression  Patient presents with problems listed below.  Will benefit from acute PT to maximize independence prior to discharge home with family.  Do not anticipate any f/u PT needs.    PT Assessment  Patient needs continued PT services    Follow Up Recommendations  No PT follow up;Supervision/Assistance - 24 hour    Does the patient have the potential to tolerate intense rehabilitation      Barriers to Discharge        Equipment Recommendations  None recommended by PT    Recommendations for Other Services     Frequency Min 3X/week    Precautions / Restrictions Precautions Precautions: None Restrictions Weight Bearing Restrictions: No   Pertinent Vitals/Pain       Mobility  Bed Mobility Bed Mobility: Supine to Sit;Sitting - Scoot to Edge of Bed Supine to Sit: 5: Supervision;HOB elevated Sitting - Scoot to Edge of Bed: 5: Supervision Details for Bed Mobility Assistance: Assist for safety only. Transfers Transfers: Sit to Stand;Stand to Sit Sit to Stand: 5: Supervision;From bed;From toilet Stand to Sit: 5: Supervision;To toilet;To chair/3-in-1 Details for Transfer Assistance: Assist for safety only. Ambulation/Gait Ambulation/Gait Assistance: 5: Supervision Ambulation Distance (Feet): 200 Feet Assistive device: Straight cane Ambulation/Gait Assistance Details: Patient keeps cane more posterior to Rt foot during gait - min use for balance.   Patient with flexed posture and quick pace with gait.  Balance good. Gait Pattern: Step-through pattern;Trunk flexed Gait velocity: WFL General Gait Details: Did ambulate short distance of 21' without cane.    Exercises     PT Diagnosis: Generalized weakness  PT Problem List: Decreased strength;Decreased mobility PT Treatment Interventions: DME instruction;Gait training;Stair training;Functional mobility training;Therapeutic exercise;Patient/family education     PT Goals(Current goals can be found in the care plan section) Acute Rehab PT Goals Patient Stated Goal: To return home soon PT Goal Formulation: With patient Time For Goal Achievement: 05/08/13 Potential to Achieve Goals: Good  Visit Information  Last PT Received On: 05/01/13 Assistance Needed: +1 History of Present Illness: Marcus Clark is a 79 y.o. male with a past medical history significant for hypertension, hyperlipidemia, failure to thrive and severe protein calorie malnutrition; came to the hospital secondary to gradual onset of multiple intermittent episode of nausea, vomiting and diarrhea.        Prior Functioning  Home Living Family/patient expects to be discharged to:: Private residence Living Arrangements: Other relatives Lucila Maine) Available Help at Discharge: Family;Available 24 hours/day Type of Home: Apartment Home Access: Stairs to enter Entrance Stairs-Number of Steps: 1 Entrance Stairs-Rails: None Home Layout: One level Home Equipment: Cane - single point Prior Function Level of Independence: Independent with assistive device(s);Needs assistance ADL's / Homemaking Assistance Needed: Lucila Maine and daughter help with meals, housekeeping. Communication Communication: No difficulties    Cognition  Cognition Arousal/Alertness: Awake/alert Behavior During Therapy: WFL for tasks assessed/performed Overall Cognitive Status: Within Functional Limits for tasks assessed    Extremity/Trunk Assessment  Upper Extremity Assessment Upper Extremity Assessment: Overall WFL for tasks assessed Lower Extremity  Assessment Lower Extremity Assessment: Generalized weakness   Balance Balance Balance Assessed: Yes High Level Balance High Level Balance Activites: Turns;Sudden stops;Head turns High Level Balance Comments: No loss of balance with high level balance activities.  End of Session PT - End of Session Equipment Utilized During Treatment: Gait belt Activity Tolerance: Patient tolerated treatment well Patient left: in chair;with call bell/phone within reach Nurse Communication: Mobility status  GP     Vena Austria 05/01/2013, 2:58 PM Durenda Hurt. Renaldo Fiddler, [facility] Acute Rehab Services Pager [phone]

## 2013-05-01 NOTE — Progress Notes (Addendum)
[facility] PROGRESS NOTE  Marcus Clark:[id number] DOB: Mar 30, 1916 DOA: 04/30/2013 PCP: Geraldo Pitter, MD  Assessment/Plan: 1-abd pain, nausea, vomiting and diarrhea: Appears to be secondary to proctitis, basal CT abdomen and pelvis results.  -Continue Cipro and Flagyl  -Await GI pathogen panel  -Continue Supportive care, Advance diet and follow -Clinically improving. 2-Protein-calorie malnutrition, severe:  -will start ensure and encourage PO intake  3-Dehydration: Continue holding diuretics  4-leukocytosis and lactic acidosis: due to proctitis and dehydration.  -Leukocytosis resolved on antibiotics  5-left lower quadrant pain: due to colitis/proctitis.  -continue IV antibiotics as above and follow 6-BPH (benign prostatic hyperplasia)/with concerns for prostatitis .  -will continue IV antibiotics for now  -patient w/o complaints of dysuria and unremarkable UA.  -Continue flomax. 7. Left leg pain -Obtain Dopplers of left leg and follow  Code Status: DNR Family Communication: Son and daughter at bedside Disposition Plan: to home when medically ready   Consultants:  none  Procedures:  none  Antibiotics:  CIPRO 12/27>>  FLAGYL 12/27>>  HPI/Subjective: 2 loose BMs so far per nursing, denies N/V. Family at bedside and report his had some pain going up the back of his left leg  Objective: Filed Vitals:   05/01/13 1524  BP: 114/52  Pulse: 74  Temp: 97.4 F (36.3 C)  Resp: 22    Intake/Output Summary (Last 24 hours) at 05/01/13 1721 Last data filed at 05/01/13 1500  Gross per 24 hour  Intake   1480 ml  Output    500 ml  Net    980 ml   Filed Weights   04/30/13 1106 04/30/13 1820  Weight: 56.246 kg (124 lb) 56.246 kg (124 lb)    Exam:  General: alert & appropriate In NAD Cardiovascular: RRR, nl S1 s2 Respiratory: CTAB Abdomen: soft +BS NT/ND, no masses palpable Extremities: No cyanosis and no edema. No calf tenderness present    Data  Reviewed: Basic Metabolic Panel:  Recent Labs Lab 04/30/13 1119 04/30/13 1850 05/01/13 0515  NA 135  --  134*  K 4.1  --  3.7  CL 99  --  100  CO2 27  --  25  GLUCOSE 172*  --  80  BUN 25*  --  19  CREATININE 1.16  --  1.14  CALCIUM 8.7  --  7.9*  MG  --  1.7  --    Liver Function Tests:  Recent Labs Lab 04/30/13 1119  AST 20  ALT 20  ALKPHOS 48  BILITOT 0.5  PROT 7.0  ALBUMIN 3.0*    Recent Labs Lab 04/30/13 1150  LIPASE 20   No results found for this basename: AMMONIA,  in the last 168 hours CBC:  Recent Labs Lab 04/30/13 1119 05/01/13 0515  WBC 16.5* 9.7  NEUTROABS 14.0*  --   HGB 9.0* 7.4*  HCT 26.1* 22.4*  MCV 91.6 91.4  PLT 289 256   Cardiac Enzymes:  Recent Labs Lab 04/30/13 1150  TROPONINI <0.30   BNP (last 3 results) No results found for this basename: PROBNP,  in the last 8760 hours CBG: No results found for this basename: GLUCAP,  in the last 168 hours  No results found for this or any previous visit (from the past 240 hour(s)).   Studies: Ct Abdomen Pelvis Wo Contrast  04/30/2013   CLINICAL DATA:  Vomiting  EXAM: CT ABDOMEN AND PELVIS WITHOUT CONTRAST  TECHNIQUE: Multidetector CT imaging of the abdomen and pelvis was performed following the standard protocol  without IV contrast.  COMPARISON:  None.  FINDINGS: Chronic changes at the lung bases.  The calcified granuloma in the liver.  Gallbladder, spleen, pancreas, adrenal glands, and kidneys are grossly within normal limits allowing for motion artifact.  Diffuse bladder wall thickening. Prostate is mildly enlarged. It measures 5.0 cm in diameter.  There is circumferential wall thickening of the colon extending from the rectosigmoid junction to the anus. There is no evidence of colonic obstruction.  Minor degenerative changes in the lumbar spine. No vertebral compression.  Atherosclerotic vascular calcifications  Normal appendix.  IMPRESSION: There is wall thickening of the rectum. An  inflammatory process or underlying malignancy are not excluded.  Prostate enlargement.  Correlate with PSA and physical exam.  Wall thickening of the bladder.  Correlate with urinalysis.   Electronically Signed   By: Maryclare Bean M.D.   On: 04/30/2013 15:29   Dg Abd Acute W/chest  04/30/2013   CLINICAL DATA:  Emesis and diarrhea.  EXAM: ACUTE ABDOMEN SERIES (ABDOMEN 2 VIEW & CHEST 1 VIEW)  COMPARISON:  Chest radiographs 07/30/2011  FINDINGS: The cardiomediastinal silhouette is within normal limits. The lungs remain hyperinflated with similar appearance of irregular parenchymal density and pleural thickening involving the left upper lobe and left lung apex. Mild linear density in the left lung base also appears unchanged and is suggestive of scarring. There is no definite evidence of new airspace consolidation, edema, pleural effusion, or pneumothorax.  There is no evidence of intraperitoneal free air. No air-fluid levels are identified. Gas is present in loops of small and large bowel without evidence of obstruction. There are a few mildly prominent loops of bowel in the left mid and lower abdomen with suggestion of mild wall thickening or irregularity. No acute osseous abnormality is identified.  IMPRESSION: 1. Stable appearance of the lungs without evidence of acute airspace disease. 2. No evidence of bowel obstruction. A few mildly prominent loops of bowel are present in the left hemi abdomen, query enteritis.   Electronically Signed   By: Sebastian Ache   On: 04/30/2013 12:48    Scheduled Meds: . amLODipine  5 mg Oral Daily  . atorvastatin  20 mg Oral Daily  . ciprofloxacin  400 mg Intravenous Q12H  . feeding supplement (ENSURE COMPLETE)  237 mL Oral TID BM  . heparin  5,000 Units Subcutaneous Q8H  . metronidazole  500 mg Intravenous Q8H  . tamsulosin  0.4 mg Oral QPC supper   Continuous Infusions: . sodium chloride 75 mL/hr (04/30/13 2337)    Active Problems:   Protein-calorie malnutrition,  severe   Dehydration   Nausea vomiting and diarrhea   Left lower quadrant pain   Proctitis   BPH (benign prostatic hyperplasia)    Time spent: 35    Myrle Dues C  [facility] Pager [phone]. If 7PM-7AM, please contact night-coverage at [email or web address], password [facility] 05/01/2013, 5:21 PM  LOS: 1 day

## 2013-05-02 DIAGNOSIS — M79609 Pain in unspecified limb: Secondary | ICD-10-CM

## 2013-05-02 DIAGNOSIS — I10 Essential (primary) hypertension: Secondary | ICD-10-CM | POA: Diagnosis not present

## 2013-05-02 DIAGNOSIS — R197 Diarrhea, unspecified: Secondary | ICD-10-CM | POA: Diagnosis not present

## 2013-05-02 DIAGNOSIS — K6289 Other specified diseases of anus and rectum: Secondary | ICD-10-CM | POA: Diagnosis not present

## 2013-05-02 LAB — GI PATHOGEN PANEL BY PCR, STOOL
C difficile toxin A/B: NEGATIVE
Campylobacter by PCR: NEGATIVE
Cryptosporidium by PCR: NEGATIVE
E coli 0157 by PCR: NEGATIVE
G lamblia by PCR: NEGATIVE

## 2013-05-02 NOTE — Progress Notes (Signed)
[facility] PROGRESS NOTE  Marcus Clark XBJ:[id number] DOB: [birth year] DOA: 04/30/2013 PCP: Geraldo Pitter, MD  Assessment/Plan: 1-abd pain, nausea, vomiting and diarrhea: Appears to be secondary to proctitis, basal CT abdomen and pelvis results.  -Continue Cipro and Flagyl  -GI pathogen panel neg  -Continue Supportive care, -follow and if continues to Clinically improve will paln d/c in am. 2-Protein-calorie malnutrition, severe:  -will start ensure and encourage PO intake  3-Dehydration: Continue holding diuretics  4-leukocytosis and lactic acidosis: due to proctitis and dehydration.  -Leukocytosis resolved on antibiotics  5-left lower quadrant pain: due to colitis/proctitis.  -continue IV antibiotics as above and follow 6-BPH (benign prostatic hyperplasia)/with concerns for prostatitis .  -will continue IV antibiotics for now  -patient w/o complaints of dysuria and unremarkable UA.  -Continue flomax. 7. Left leg pain -await Dopplers of left leg and follow  Code Status: DNR Family Communication: none at bedside Disposition Plan: to home when medically ready   Consultants:  none  Procedures:  none  Antibiotics:  CIPRO 12/27>>  FLAGYL 12/27>>  HPI/Subjective: Diarrhea decreasing, states he vomited after eating last pm.tolerated breakfast this am.  Objective: Filed Vitals:   05/02/13 0537  BP: 133/74  Pulse: 65  Temp: 98 F (36.7 C)  Resp: 22    Intake/Output Summary (Last 24 hours) at 05/02/13 1154 Last data filed at 05/02/13 0500  Gross per 24 hour  Intake   2162 ml  Output      0 ml  Net   2162 ml   Filed Weights   04/30/13 1106 04/30/13 1820  Weight: 56.246 kg (124 lb) 56.246 kg (124 lb)    Exam:  General: alert & appropriate In NAD Cardiovascular: RRR, nl S1 s2 Respiratory: CTAB Abdomen: soft +BS NT/ND, no masses palpable Extremities: No cyanosis and no edema. No calf tenderness present    Data Reviewed: Basic Metabolic  Panel:  Recent Labs Lab 04/30/13 1119 04/30/13 1850 05/01/13 0515  NA 135  --  134*  K 4.1  --  3.7  CL 99  --  100  CO2 27  --  25  GLUCOSE 172*  --  80  BUN 25*  --  19  CREATININE 1.16  --  1.14  CALCIUM 8.7  --  7.9*  MG  --  1.7  --    Liver Function Tests:  Recent Labs Lab 04/30/13 1119  AST 20  ALT 20  ALKPHOS 48  BILITOT 0.5  PROT 7.0  ALBUMIN 3.0*    Recent Labs Lab 04/30/13 1150  LIPASE 20   No results found for this basename: AMMONIA,  in the last 168 hours CBC:  Recent Labs Lab 04/30/13 1119 05/01/13 0515  WBC 16.5* 9.7  NEUTROABS 14.0*  --   HGB 9.0* 7.4*  HCT 26.1* 22.4*  MCV 91.6 91.4  PLT 289 256   Cardiac Enzymes:  Recent Labs Lab 04/30/13 1150  TROPONINI <0.30   BNP (last 3 results) No results found for this basename: PROBNP,  in the last 8760 hours CBG: No results found for this basename: GLUCAP,  in the last 168 hours  No results found for this or any previous visit (from the past 240 hour(s)).   Studies: Ct Abdomen Pelvis Wo Contrast  04/30/2013   CLINICAL DATA:  Vomiting  EXAM: CT ABDOMEN AND PELVIS WITHOUT CONTRAST  TECHNIQUE: Multidetector CT imaging of the abdomen and pelvis was performed following the standard protocol without IV contrast.  COMPARISON:  None.  FINDINGS:  Chronic changes at the lung bases.  The calcified granuloma in the liver.  Gallbladder, spleen, pancreas, adrenal glands, and kidneys are grossly within normal limits allowing for motion artifact.  Diffuse bladder wall thickening. Prostate is mildly enlarged. It measures 5.0 cm in diameter.  There is circumferential wall thickening of the colon extending from the rectosigmoid junction to the anus. There is no evidence of colonic obstruction.  Minor degenerative changes in the lumbar spine. No vertebral compression.  Atherosclerotic vascular calcifications  Normal appendix.  IMPRESSION: There is wall thickening of the rectum. An inflammatory process or  underlying malignancy are not excluded.  Prostate enlargement.  Correlate with PSA and physical exam.  Wall thickening of the bladder.  Correlate with urinalysis.   Electronically Signed   By: Maryclare Bean M.D.   On: 04/30/2013 15:29   Dg Abd Acute W/chest  04/30/2013   CLINICAL DATA:  Emesis and diarrhea.  EXAM: ACUTE ABDOMEN SERIES (ABDOMEN 2 VIEW & CHEST 1 VIEW)  COMPARISON:  Chest radiographs 07/30/2011  FINDINGS: The cardiomediastinal silhouette is within normal limits. The lungs remain hyperinflated with similar appearance of irregular parenchymal density and pleural thickening involving the left upper lobe and left lung apex. Mild linear density in the left lung base also appears unchanged and is suggestive of scarring. There is no definite evidence of new airspace consolidation, edema, pleural effusion, or pneumothorax.  There is no evidence of intraperitoneal free air. No air-fluid levels are identified. Gas is present in loops of small and large bowel without evidence of obstruction. There are a few mildly prominent loops of bowel in the left mid and lower abdomen with suggestion of mild wall thickening or irregularity. No acute osseous abnormality is identified.  IMPRESSION: 1. Stable appearance of the lungs without evidence of acute airspace disease. 2. No evidence of bowel obstruction. A few mildly prominent loops of bowel are present in the left hemi abdomen, query enteritis.   Electronically Signed   By: Sebastian Ache   On: 04/30/2013 12:48    Scheduled Meds: . amLODipine  5 mg Oral Daily  . atorvastatin  20 mg Oral Daily  . ciprofloxacin  400 mg Intravenous Q12H  . feeding supplement (ENSURE COMPLETE)  237 mL Oral TID BM  . heparin  5,000 Units Subcutaneous Q8H  . metronidazole  500 mg Intravenous Q8H  . tamsulosin  0.4 mg Oral QPC supper   Continuous Infusions: . sodium chloride 75 mL/hr (04/30/13 2337)    Active Problems:   Protein-calorie malnutrition, severe   Dehydration    Nausea vomiting and diarrhea   Left lower quadrant pain   Proctitis   BPH (benign prostatic hyperplasia)    Time spent: 35    Alli Jasmer C  [facility] Pager [phone]. If 7PM-7AM, please contact night-coverage at [email or web address], password [facility] 05/02/2013, 11:54 AM  LOS: 2 days

## 2013-05-02 NOTE — Progress Notes (Signed)
Physical Therapy Treatment Patient Details Name: GRAVIEL PAYEUR MRN: [id number] DOB: [birth year] Today's Date: 05/02/2013 Time: 2951-8841 PT Time Calculation (min): 24 min  PT Assessment / Plan / Recommendation  History of Present Illness ZACKERIAH KISSLER is a 79 y.o. male with a past medical history significant for hypertension, hyperlipidemia, failure to thrive and severe protein calorie malnutrition; came to the hospital secondary to gradual onset of multiple intermittent episode of nausea, vomiting and diarrhea.    PT Comments   Patient progressing well. Spoke with patient regarding assistance at home. Patient states that his grandson lives with him and does not work so can assist when needed. Patient eager to DC home. Will need to attempt steps next visit  Follow Up Recommendations  No PT follow up;Supervision/Assistance - 24 hour     Does the patient have the potential to tolerate intense rehabilitation     Barriers to Discharge        Equipment Recommendations  None recommended by PT    Recommendations for Other Services    Frequency Min 3X/week   Progress towards PT Goals Progress towards PT goals: Progressing toward goals  Plan Current plan remains appropriate    Precautions / Restrictions Precautions Precautions: None   Pertinent Vitals/Pain no apparent distress     Mobility  Bed Mobility Supine to Sit: 5: Supervision;HOB elevated Sitting - Scoot to Edge of Bed: 5: Supervision Transfers Sit to Stand: 5: Supervision;From bed;From toilet Stand to Sit: 5: Supervision;To toilet;To chair/3-in-1 Ambulation/Gait Ambulation/Gait Assistance: 5: Supervision Ambulation Distance (Feet): 600 Feet Ambulation/Gait Assistance Details: Continues with flexed shoulders and quick gait but steady with no LOB Gait Pattern: Step-through pattern;Trunk flexed Gait velocity: WFL    Exercises     PT Diagnosis:    PT Problem List:   PT Treatment Interventions:     PT Goals (current  goals can now be found in the care plan section)    Visit Information  Last PT Received On: 05/02/13 Assistance Needed: +1 History of Present Illness: KAIYDEN SIMKIN is a 79 y.o. male with a past medical history significant for hypertension, hyperlipidemia, failure to thrive and severe protein calorie malnutrition; came to the hospital secondary to gradual onset of multiple intermittent episode of nausea, vomiting and diarrhea.     Subjective Data      Cognition  Cognition Arousal/Alertness: Awake/alert Behavior During Therapy: WFL for tasks assessed/performed Overall Cognitive Status: Within Functional Limits for tasks assessed    Balance     End of Session PT - End of Session Equipment Utilized During Treatment: Gait belt Activity Tolerance: Patient tolerated treatment well Patient left: in chair;with call bell/phone within reach Nurse Communication: Mobility status   GP     Fredrich Birks 05/02/2013, 8:59 AM  05/02/2013 Fredrich Birks PTA [phone] pager [phone] office

## 2013-05-02 NOTE — Progress Notes (Signed)
INITIAL NUTRITION ASSESSMENT  DOCUMENTATION CODES Per approved criteria  -Severe malnutrition in the context of chronic illness   INTERVENTION: 1.  Modify diet; consider diet liberalization to Regular 2.  Supplements; Ensure Complete po BID, each supplement provides 350 kcal and 13 grams of protein  NUTRITION DIAGNOSIS: Underweight related to poor appetite and intake as evidenced by BMI <18.5.   Monitor:  1.  Food/Beverage; pt meeting >/=90% estimated needs with tolerance. 2.  Wt/wt change; monitor trends  Reason for Assessment: BMI  79 y.o. male  Admitting Dx: abdominal pain  ASSESSMENT: Pt admitted with abdominal pain. Pt with h/o hypertension, hyperlipidemia, FTT, and severe protein-calorie malnutrition.  Pt occasionally drinks supplements at home.  Pt in the bathroom with NT at time of visit. No family at bedside.  Pt has been dx with severe protein-calorie malnutrition which is likely ongoing giving report on admission and lack of improvement in wt status. (Last seen by RD in 2013). RD to follow for ongoing assessment and nutrition-related interventions.    Height: Ht Readings from Last 1 Encounters:  04/30/13 5\' 9"  (1.753 m)    Weight: Wt Readings from Last 1 Encounters:  04/30/13 124 lb (56.246 kg)    Ideal Body Weight: 72.7 kg  % Ideal Body Weight: 77%  Wt Readings from Last 10 Encounters:  04/30/13 124 lb (56.246 kg)  07/30/11 126 lb (57.153 kg)  07/02/11 126 lb 9.6 oz (57.425 kg)  06/24/11 122 lb (55.339 kg)    Usual Body Weight: 125 lbs, on average  % Usual Body Weight: 100%  BMI:  Body mass index is 18.3 kg/(m^2).  Estimated Nutritional Needs: Kcal: 1500-1700 Protein: 75-85g Fluid: >1.5 L/day  Skin: intact  Diet Order: Cardiac  EDUCATION NEEDS: -Education needs addressed   Intake/Output Summary (Last 24 hours) at 05/02/13 1230 Last data filed at 05/02/13 0500  Gross per 24 hour  Intake   2162 ml  Output      0 ml  Net   2162 ml     Last BM: 12/28  Labs:   Recent Labs Lab 04/30/13 1119 04/30/13 1850 05/01/13 0515  NA 135  --  134*  K 4.1  --  3.7  CL 99  --  100  CO2 27  --  25  BUN 25*  --  19  CREATININE 1.16  --  1.14  CALCIUM 8.7  --  7.9*  MG  --  1.7  --   GLUCOSE 172*  --  80    CBG (last 3)  No results found for this basename: GLUCAP,  in the last 72 hours  Scheduled Meds: . amLODipine  5 mg Oral Daily  . atorvastatin  20 mg Oral Daily  . ciprofloxacin  400 mg Intravenous Q12H  . feeding supplement (ENSURE COMPLETE)  237 mL Oral TID BM  . heparin  5,000 Units Subcutaneous Q8H  . metronidazole  500 mg Intravenous Q8H  . tamsulosin  0.4 mg Oral QPC supper    Continuous Infusions: . sodium chloride 75 mL/hr (04/30/13 2337)    Past Medical History  Diagnosis Date  . Hypertension   . CHF (congestive heart failure)   . Shortness of breath     Past Surgical History  Procedure Laterality Date  . Bladder surgery      had tumor removed 20 yrs ago    Loyce Dys, MS RD LDN Clinical Inpatient Dietitian Pager: [phone] Weekend/After hours pager: [phone]

## 2013-05-02 NOTE — Progress Notes (Signed)
*  Preliminary Results* Bilateral lower extremity venous duplex completed. Bilateral lower extremities are negative for deep vein thrombosis. There is no evidence of Baker's cyst bilaterally.  05/02/2013  Gertie Fey, RVT, RDCS, RDMS

## 2013-05-02 NOTE — Plan of Care (Signed)
Problem: Phase I Progression Outcomes Goal: Pain controlled with appropriate interventions Outcome: Adequate for Discharge Pt denies any pain, has not taken any pain medication

## 2013-05-03 DIAGNOSIS — K6289 Other specified diseases of anus and rectum: Secondary | ICD-10-CM | POA: Diagnosis not present

## 2013-05-03 DIAGNOSIS — R197 Diarrhea, unspecified: Secondary | ICD-10-CM | POA: Diagnosis not present

## 2013-05-03 DIAGNOSIS — R112 Nausea with vomiting, unspecified: Secondary | ICD-10-CM | POA: Diagnosis not present

## 2013-05-03 DIAGNOSIS — I10 Essential (primary) hypertension: Secondary | ICD-10-CM | POA: Diagnosis not present

## 2013-05-03 LAB — CBC
HCT: 23.5 % — ABNORMAL LOW (ref 39.0–52.0)
Hemoglobin: 8.1 g/dL — ABNORMAL LOW (ref 13.0–17.0)
MCH: 31.2 pg (ref 26.0–34.0)
Platelets: 252 10*3/uL (ref 150–400)
RBC: 2.6 MIL/uL — ABNORMAL LOW (ref 4.22–5.81)
RDW: 13.6 % (ref 11.5–15.5)
WBC: 10.8 10*3/uL — ABNORMAL HIGH (ref 4.0–10.5)

## 2013-05-03 LAB — VITAMIN B12: Vitamin B-12: 389 pg/mL (ref 211–911)

## 2013-05-03 LAB — BASIC METABOLIC PANEL
BUN: 11 mg/dL (ref 6–23)
CO2: 23 mEq/L (ref 19–32)
Calcium: 7.7 mg/dL — ABNORMAL LOW (ref 8.4–10.5)
GFR calc non Af Amer: 62 mL/min — ABNORMAL LOW (ref >90)
Glucose, Bld: 118 mg/dL — ABNORMAL HIGH (ref 70–99)
Potassium: 3.6 mEq/L — ABNORMAL LOW (ref 3.7–5.3)
Sodium: 137 mEq/L (ref 137–147)

## 2013-05-03 LAB — RETICULOCYTES
RBC.: 2.6 MIL/uL — ABNORMAL LOW (ref 4.22–5.81)
Retic Ct Pct: 1.3 % (ref 0.4–3.1)

## 2013-05-03 LAB — IRON AND TIBC: Iron: 43 ug/dL (ref 42–135)

## 2013-05-03 MED ORDER — DIPHENOXYLATE-ATROPINE 2.5-0.025 MG PO TABS: 1.0000 | ORAL_TABLET | Freq: Two times a day (BID) | ORAL | Status: DC

## 2013-05-03 MED ORDER — SACCHAROMYCES BOULARDII 250 MG PO CAPS
250.0000 mg | ORAL_CAPSULE | Freq: Two times a day (BID) | ORAL | Status: DC
  Administered 2013-05-03 – 2013-05-04 (×2): 250 mg via ORAL
  Filled 2013-05-03 (×5): qty 1

## 2013-05-03 NOTE — Progress Notes (Signed)
[facility] PROGRESS NOTE  RAMESES OU ZOX:[id number] DOB: [birth year] DOA: 04/30/2013 PCP: Geraldo Pitter, MD  Assessment/Plan: 1-abd pain, nausea, vomiting and diarrhea: Appears to be secondary to proctitis, based CT abdomen and pelvis results.  -Continue Cipro and Flagyl  -GI pathogen panel neg  -Continue Supportive care, -Still with diarrhea overnight, will add florastor and follow 2-Protein-calorie malnutrition, severe:  -will start ensure and encourage PO intake  3-Dehydration: Continue holding diuretics  4-leukocytosis and lactic acidosis: due to proctitis and dehydration.  -Leukocytosis resolved on antibiotics  5-left lower quadrant pain: due to colitis/proctitis.  -continue IV antibiotics as above and follow 6-BPH (benign prostatic hyperplasia)/with concerns for prostatitis .  -will continue IV antibiotics for now  -patient w/o complaints of dysuria and unremarkable UA.  -Continue flomax. 7. Left leg pain -Dopplers of left leg negative for DVT  Code Status: DNR Family Communication: none at bedside Disposition Plan: to home when medically ready   Consultants:  none  Procedures:  none  Antibiotics:  CIPRO 12/27>>  FLAGYL 12/27>>  HPI/Subjective: Still with Diarrhea. Tolerating by mouth well  Objective: Filed Vitals:   05/03/13 0510  BP: 122/36  Pulse: 86  Temp: 98.7 F (37.1 C)  Resp: 20    Intake/Output Summary (Last 24 hours) at 05/03/13 1146 Last data filed at 05/03/13 8119  Gross per 24 hour  Intake   2190 ml  Output    250 ml  Net   1940 ml   Filed Weights   04/30/13 1106 04/30/13 1820  Weight: 56.246 kg (124 lb) 56.246 kg (124 lb)    Exam:  General: alert & appropriate In NAD Cardiovascular: RRR, nl S1 s2 Respiratory: CTAB Abdomen: soft +BS NT/ND, no masses palpable Extremities: No cyanosis and no edema. No calf tenderness present    Data Reviewed: Basic Metabolic Panel:  Recent Labs Lab 04/30/13 1119  04/30/13 1850 05/01/13 0515 05/03/13 0900  NA 135  --  134* 137  K 4.1  --  3.7 3.6*  CL 99  --  100 101  CO2 27  --  25 23  GLUCOSE 172*  --  80 118*  BUN 25*  --  19 11  CREATININE 1.16  --  1.14 1.00  CALCIUM 8.7  --  7.9* 7.7*  MG  --  1.7  --   --    Liver Function Tests:  Recent Labs Lab 04/30/13 1119  AST 20  ALT 20  ALKPHOS 48  BILITOT 0.5  PROT 7.0  ALBUMIN 3.0*    Recent Labs Lab 04/30/13 1150  LIPASE 20   No results found for this basename: AMMONIA,  in the last 168 hours CBC:  Recent Labs Lab 04/30/13 1119 05/01/13 0515  WBC 16.5* 9.7  NEUTROABS 14.0*  --   HGB 9.0* 7.4*  HCT 26.1* 22.4*  MCV 91.6 91.4  PLT 289 256   Cardiac Enzymes:  Recent Labs Lab 04/30/13 1150  TROPONINI <0.30   BNP (last 3 results) No results found for this basename: PROBNP,  in the last 8760 hours CBG: No results found for this basename: GLUCAP,  in the last 168 hours  No results found for this or any previous visit (from the past 240 hour(s)).   Studies: No results found.  Scheduled Meds: . amLODipine  5 mg Oral Daily  . atorvastatin  20 mg Oral Daily  . ciprofloxacin  400 mg Intravenous Q12H  . diphenoxylate-atropine  1 tablet Oral BID  . feeding supplement (ENSURE COMPLETE)  237 mL Oral TID BM  . heparin  5,000 Units Subcutaneous Q8H  . metronidazole  500 mg Intravenous Q8H  . tamsulosin  0.4 mg Oral QPC supper   Continuous Infusions: . sodium chloride 75 mL/hr at 05/03/13 0917    Active Problems:   Protein-calorie malnutrition, severe   Dehydration   Nausea vomiting and diarrhea   Left lower quadrant pain   Proctitis   BPH (benign prostatic hyperplasia)    Time spent: 25    [facility] C  [facility] Pager [phone]. If 7PM-7AM, please contact night-coverage at [email or web address], password [facility] 05/03/2013, 11:46 AM  LOS: 3 days

## 2013-05-03 NOTE — Progress Notes (Signed)
Physical Therapy Treatment Patient Details Name: Marcus Clark MRN: [id number] DOB: 1916-01-18 Today's Date: 05/03/2013 Time: 7829-5621 PT Time Calculation (min): 24 min  PT Assessment / Plan / Recommendation  History of Present Illness Marcus Clark is a 79 y.o. male with a past medical history significant for hypertension, hyperlipidemia, failure to thrive and severe protein calorie malnutrition; came to the hospital secondary to gradual onset of multiple intermittent episode of nausea, vomiting and diarrhea.    PT Comments   Patient continues to progress well. Eager to DC home. Able to work on stairs this morning. Would like to keep on caseload for one more session to practice steps again,  if patient doesn't DC in the next day or two.  Follow Up Recommendations  No PT follow up;Supervision/Assistance - 24 hour     Does the patient have the potential to tolerate intense rehabilitation     Barriers to Discharge        Equipment Recommendations  None recommended by PT    Recommendations for Other Services    Frequency Min 3X/week   Progress towards PT Goals Progress towards PT goals: Progressing toward goals  Plan Current plan remains appropriate    Precautions / Restrictions Precautions Precautions: None   Pertinent Vitals/Pain no apparent distress     Mobility  Bed Mobility Supine to Sit: 6: Modified independent (Device/Increase time) Sitting - Scoot to Edge of Bed: 7: Independent Transfers Sit to Stand: 5: Supervision;From bed;From toilet Stand to Sit: 5: Supervision;To toilet;To chair/3-in-1 Details for Transfer Assistance: Supervision for safety Ambulation/Gait Ambulation/Gait Assistance: 5: Supervision Ambulation Distance (Feet): 600 Feet Assistive device: Straight cane Gait Pattern: Step-through pattern;Trunk flexed Gait velocity: WFL Stairs: Yes Stairs Assistance: 4: Min assist Stair Management Technique: Forwards Number of Stairs: 2    Exercises      PT Diagnosis:    PT Problem List:   PT Treatment Interventions:     PT Goals (current goals can now be found in the care plan section)    Visit Information  Last PT Received On: 05/03/13 Assistance Needed: +1 History of Present Illness: Marcus Clark is a 79 y.o. male with a past medical history significant for hypertension, hyperlipidemia, failure to thrive and severe protein calorie malnutrition; came to the hospital secondary to gradual onset of multiple intermittent episode of nausea, vomiting and diarrhea.     Subjective Data      Cognition  Cognition Arousal/Alertness: Awake/alert Behavior During Therapy: WFL for tasks assessed/performed Overall Cognitive Status: Within Functional Limits for tasks assessed    Balance     End of Session PT - End of Session Equipment Utilized During Treatment: Gait belt Activity Tolerance: Patient tolerated treatment well Patient left: in chair;with call bell/phone within reach Nurse Communication: Mobility status   GP     Fredrich Birks 05/03/2013, 10:11 AM 05/03/2013 Fredrich Birks PTA [phone] pager [phone] office

## 2013-05-04 DIAGNOSIS — R197 Diarrhea, unspecified: Secondary | ICD-10-CM | POA: Diagnosis not present

## 2013-05-04 DIAGNOSIS — K5289 Other specified noninfective gastroenteritis and colitis: Secondary | ICD-10-CM | POA: Diagnosis not present

## 2013-05-04 DIAGNOSIS — K6289 Other specified diseases of anus and rectum: Secondary | ICD-10-CM | POA: Diagnosis not present

## 2013-05-04 DIAGNOSIS — N400 Enlarged prostate without lower urinary tract symptoms: Secondary | ICD-10-CM | POA: Diagnosis not present

## 2013-05-04 MED ORDER — METRONIDAZOLE 500 MG PO TABS: 500.0000 mg | ORAL_TABLET | Freq: Three times a day (TID) | ORAL | Status: DC

## 2013-05-04 MED ORDER — SACCHAROMYCES BOULARDII 250 MG PO CAPS: 250.0000 mg | ORAL_CAPSULE | Freq: Two times a day (BID) | ORAL | Status: DC

## 2013-05-04 MED ORDER — CIPROFLOXACIN HCL 500 MG PO TABS: 500.0000 mg | ORAL_TABLET | Freq: Two times a day (BID) | ORAL | Status: DC

## 2013-05-04 MED ORDER — TAMSULOSIN HCL 0.4 MG PO CAPS: 0.4000 mg | ORAL_CAPSULE | Freq: Every day | ORAL | Status: DC

## 2013-05-04 NOTE — Discharge Summary (Signed)
Physician Discharge Summary  Marcus Clark WUJ:[id number] DOB: Jan 08, 1916 DOA: 04/30/2013  PCP: Geraldo Pitter, MD  Admit date: 04/30/2013 Discharge date: 05/04/2013  Time spent: >38minutes  Recommendations for Outpatient Follow-up:  Follow-up Information   Follow up with Geraldo Pitter, MD. (in 1week, call for appt upon discharge)    Specialty:  Family Medicine   Contact information:   [address] [place] [address] [phone]        Discharge Diagnoses:  Active Problems:   Protein-calorie malnutrition, severe   Dehydration   Nausea vomiting and diarrhea   Left lower quadrant pain   Proctitis   BPH (benign prostatic hyperplasia)   Discharge Condition: Improved/stable  Diet recommendation: Heart healthy  Filed Weights   04/30/13 1106 04/30/13 1820  Weight: 56.246 kg (124 lb) 56.246 kg (124 lb)    History of present illness:  Marcus Clark is a 79 y.o. male with a past medical history significant for hypertension, hyperlipidemia, failure to thrive and severe protein calorie malnutrition; came to the hospital secondary to gradual onset of multiple intermittent episode of nausea, vomiting and diarrhea. Patient described that he has been for the last 2-3 days complaining of severe constipation that requires him to use some Dulcolax suppository and subsequent nonstop diarrhea happened after this intervention. Per family members patient has not been eating or drinking properly for the last 3 days or so, complaining of abd pain and has been weaker than usual. Patient itself denies any chest pain, shortness of breath, fevers, chills, hematochezia, melena or hematemesis. In the ED patient has been found with leukocytosis (WBCs in the 16,000 range), slight elevation of his lactic acid, dehydrated on physical exam and with a CT scan of the abdomen and pelvis demonstrating colitis/proctitis and prostate enlargement with presumed prostatitis. [facility] has been called  to admit the patient for further evaluation and treatment.   Hospital Course:  1- probable proctitis  -As discussed above patient presented with abd pain, nausea, vomiting and diarrhea:  CT abdomen and pelvis wall thickening of the rectum and prostate enlargement. He was started on empiric Cipro and Flagyl, a GI pathogen panel(including C. Difficile) was done and came back all negative. His symptoms gradually improved-his nausea vomiting resolved and he was started on by mouth which he is tolerating The diarrhea is resolved at this time, he'll be discharged on oral antibiotics to complete 10 day course and is to followup with his PCP. 2-Protein-calorie malnutrition, severe:  -Continue ensure supplements upon tdischarge 3-Dehydration: Resolved with hydration. She is to resume his outpatient medications upon discharge the  4-leukocytosis and lactic acidosis: due to proctitis and dehydration.  -Leukocytosis resolved on antibiotics  5-left lower quadrant pain: due to colitis/proctitis.  -continue IV antibiotics as above and follow  6-BPH (benign prostatic hyperplasia)  -Patient was placed on Flomax. Initially there was concern for prostatitis but UA was negative and patient has remained afebrile ans has not had any further tones consistent with prostatitis. He is to continue Flomax upon discharge. 7. Left leg pain  -Dopplers of left leg negative for DVT -PT was consulted and saw patient in the hospital and recommended no further skilled PT needs.  Procedures: Doppler ultrasound of lower extremities-negative for DVT Consultations:  none  Discharge Exam: Filed Vitals:   05/04/13 0520  BP: 123/57  Pulse: 77  Temp: 98.4 F (36.9 C)  Resp: 20    Exam:  General: alert & appropriate In NAD  Cardiovascular: RRR, nl S1  s2  Respiratory: CTAB  Abdomen: soft +BS NT/ND, no masses palpable  Extremities: No cyanosis and no edema. No calf tenderness present  Discharge Instructions  Discharge  Orders   Future Orders Complete By Expires   Diet - low sodium heart healthy  As directed    Increase activity slowly  As directed        Medication List         ciprofloxacin 500 MG tablet  Commonly known as:  CIPRO  Take 1 tablet (500 mg total) by mouth 2 (two) times daily.     feeding supplement Liqd  Take 237 mLs by mouth 3 (three) times daily between meals.     losartan-hydrochlorothiazide 100-25 MG per tablet  Commonly known as:  HYZAAR  Take 1 tablet by mouth daily.     metroNIDAZOLE 500 MG tablet  Commonly known as:  FLAGYL  Take 1 tablet (500 mg total) by mouth 3 (three) times daily.     saccharomyces boulardii 250 MG capsule  Commonly known as:  FLORASTOR  Take 1 capsule (250 mg total) by mouth 2 (two) times daily.     simvastatin 40 MG tablet  Commonly known as:  ZOCOR  Take 40 mg by mouth every morning.     tamsulosin 0.4 MG Caps capsule  Commonly known as:  FLOMAX  Take 1 capsule (0.4 mg total) by mouth daily after supper.       Allergies  Allergen Reactions  . Contrast Media [Iodinated Diagnostic Agents] Hives       Follow-up Information   Follow up with Geraldo Pitter, MD. (in 1week, call for appt upon discharge)    Specialty:  Family Medicine   Contact information:   [address] [place] [address] [phone]        The results of significant diagnostics from this hospitalization (including imaging, microbiology, ancillary and laboratory) are listed below for reference.    Significant Diagnostic Studies: Ct Abdomen Pelvis Wo Contrast  04/30/2013   CLINICAL DATA:  Vomiting  EXAM: CT ABDOMEN AND PELVIS WITHOUT CONTRAST  TECHNIQUE: Multidetector CT imaging of the abdomen and pelvis was performed following the standard protocol without IV contrast.  COMPARISON:  None.  FINDINGS: Chronic changes at the lung bases.  The calcified granuloma in the liver.  Gallbladder, spleen, pancreas, adrenal glands, and kidneys are grossly within  normal limits allowing for motion artifact.  Diffuse bladder wall thickening. Prostate is mildly enlarged. It measures 5.0 cm in diameter.  There is circumferential wall thickening of the colon extending from the rectosigmoid junction to the anus. There is no evidence of colonic obstruction.  Minor degenerative changes in the lumbar spine. No vertebral compression.  Atherosclerotic vascular calcifications  Normal appendix.  IMPRESSION: There is wall thickening of the rectum. An inflammatory process or underlying malignancy are not excluded.  Prostate enlargement.  Correlate with PSA and physical exam.  Wall thickening of the bladder.  Correlate with urinalysis.   Electronically Signed   By: Maryclare Bean M.D.   On: 04/30/2013 15:29   Dg Abd Acute W/chest  04/30/2013   CLINICAL DATA:  Emesis and diarrhea.  EXAM: ACUTE ABDOMEN SERIES (ABDOMEN 2 VIEW & CHEST 1 VIEW)  COMPARISON:  Chest radiographs 07/30/2011  FINDINGS: The cardiomediastinal silhouette is within normal limits. The lungs remain hyperinflated with similar appearance of irregular parenchymal density and pleural thickening involving the left upper lobe and left lung apex. Mild linear density in the left lung base  also appears unchanged and is suggestive of scarring. There is no definite evidence of new airspace consolidation, edema, pleural effusion, or pneumothorax.  There is no evidence of intraperitoneal free air. No air-fluid levels are identified. Gas is present in loops of small and large bowel without evidence of obstruction. There are a few mildly prominent loops of bowel in the left mid and lower abdomen with suggestion of mild wall thickening or irregularity. No acute osseous abnormality is identified.  IMPRESSION: 1. Stable appearance of the lungs without evidence of acute airspace disease. 2. No evidence of bowel obstruction. A few mildly prominent loops of bowel are present in the left hemi abdomen, query enteritis.   Electronically Signed    By: Sebastian Ache   On: 04/30/2013 12:48    Microbiology: No results found for this or any previous visit (from the past 240 hour(s)).   Labs: Basic Metabolic Panel:  Recent Labs Lab 04/30/13 1119 04/30/13 1850 05/01/13 0515 05/03/13 0900  NA 135  --  134* 137  K 4.1  --  3.7 3.6*  CL 99  --  100 101  CO2 27  --  25 23  GLUCOSE 172*  --  80 118*  BUN 25*  --  19 11  CREATININE 1.16  --  1.14 1.00  CALCIUM 8.7  --  7.9* 7.7*  MG  --  1.7  --   --    Liver Function Tests:  Recent Labs Lab 04/30/13 1119  AST 20  ALT 20  ALKPHOS 48  BILITOT 0.5  PROT 7.0  ALBUMIN 3.0*    Recent Labs Lab 04/30/13 1150  LIPASE 20   No results found for this basename: AMMONIA,  in the last 168 hours CBC:  Recent Labs Lab 04/30/13 1119 05/01/13 0515 05/03/13 1225  WBC 16.5* 9.7 10.8*  NEUTROABS 14.0*  --   --   HGB 9.0* 7.4* 8.1*  HCT 26.1* 22.4* 23.5*  MCV 91.6 91.4 90.4  PLT 289 256 252   Cardiac Enzymes:  Recent Labs Lab 04/30/13 1150  TROPONINI <0.30   BNP: BNP (last 3 results) No results found for this basename: PROBNP,  in the last 8760 hours CBG: No results found for this basename: GLUCAP,  in the last 168 hours     Signed:  Arielle Eber C  [facility] 05/04/2013, 11:11 AM

## 2013-05-04 NOTE — Progress Notes (Signed)
Patient discharged to home with instructions. 

## 2013-05-13 DIAGNOSIS — M1250 Traumatic arthropathy, unspecified site: Secondary | ICD-10-CM | POA: Diagnosis not present

## 2013-05-13 DIAGNOSIS — E7800 Pure hypercholesterolemia, unspecified: Secondary | ICD-10-CM | POA: Diagnosis not present

## 2013-05-13 DIAGNOSIS — I10 Essential (primary) hypertension: Secondary | ICD-10-CM | POA: Diagnosis not present

## 2013-06-15 DIAGNOSIS — R634 Abnormal weight loss: Secondary | ICD-10-CM | POA: Diagnosis not present

## 2013-07-15 DIAGNOSIS — R634 Abnormal weight loss: Secondary | ICD-10-CM | POA: Diagnosis not present

## 2013-08-15 DIAGNOSIS — R6251 Failure to thrive (child): Secondary | ICD-10-CM | POA: Diagnosis not present

## 2013-09-23 DIAGNOSIS — Z961 Presence of intraocular lens: Secondary | ICD-10-CM | POA: Diagnosis not present

## 2013-09-27 DIAGNOSIS — R6251 Failure to thrive (child): Secondary | ICD-10-CM | POA: Diagnosis not present

## 2013-10-13 DIAGNOSIS — K5289 Other specified noninfective gastroenteritis and colitis: Secondary | ICD-10-CM | POA: Diagnosis not present

## 2013-11-08 DIAGNOSIS — F0390 Unspecified dementia without behavioral disturbance: Secondary | ICD-10-CM | POA: Diagnosis not present

## 2013-11-08 DIAGNOSIS — R609 Edema, unspecified: Secondary | ICD-10-CM | POA: Diagnosis not present

## 2013-11-08 DIAGNOSIS — D649 Anemia, unspecified: Secondary | ICD-10-CM | POA: Diagnosis not present

## 2013-11-08 DIAGNOSIS — E781 Pure hyperglyceridemia: Secondary | ICD-10-CM | POA: Diagnosis not present

## 2013-11-08 DIAGNOSIS — R5381 Other malaise: Secondary | ICD-10-CM | POA: Diagnosis not present

## 2013-11-08 DIAGNOSIS — R5383 Other fatigue: Secondary | ICD-10-CM | POA: Diagnosis not present

## 2013-11-23 DIAGNOSIS — R609 Edema, unspecified: Secondary | ICD-10-CM | POA: Diagnosis not present

## 2013-12-22 DIAGNOSIS — R404 Transient alteration of awareness: Secondary | ICD-10-CM | POA: Diagnosis not present

## 2013-12-22 DIAGNOSIS — K5900 Constipation, unspecified: Secondary | ICD-10-CM | POA: Diagnosis not present

## 2014-02-01 DIAGNOSIS — I10 Essential (primary) hypertension: Secondary | ICD-10-CM | POA: Diagnosis not present

## 2014-02-01 DIAGNOSIS — R6251 Failure to thrive (child): Secondary | ICD-10-CM | POA: Diagnosis not present

## 2014-02-01 DIAGNOSIS — Z23 Encounter for immunization: Secondary | ICD-10-CM | POA: Diagnosis not present

## 2014-03-03 DIAGNOSIS — I10 Essential (primary) hypertension: Secondary | ICD-10-CM | POA: Diagnosis not present

## 2014-03-03 DIAGNOSIS — R627 Adult failure to thrive: Secondary | ICD-10-CM | POA: Diagnosis not present

## 2014-04-12 ENCOUNTER — Emergency Department (HOSPITAL_COMMUNITY): Payer: Medicare Other

## 2014-04-12 ENCOUNTER — Emergency Department (HOSPITAL_COMMUNITY)
Admission: EM | Admit: 2014-04-12 | Discharge: 2014-04-12 | Disposition: A | Discharge status: Home / Self Care | Payer: Medicare Other | Attending: Emergency Medicine | Admitting: Emergency Medicine

## 2014-04-12 ENCOUNTER — Encounter (HOSPITAL_COMMUNITY): Payer: Self-pay | Admitting: Emergency Medicine

## 2014-04-12 DIAGNOSIS — R600 Localized edema: Secondary | ICD-10-CM | POA: Diagnosis not present

## 2014-04-12 DIAGNOSIS — Y998 Other external cause status: Secondary | ICD-10-CM | POA: Diagnosis not present

## 2014-04-12 DIAGNOSIS — I509 Heart failure, unspecified: Secondary | ICD-10-CM | POA: Insufficient documentation

## 2014-04-12 DIAGNOSIS — Z87891 Personal history of nicotine dependence: Secondary | ICD-10-CM | POA: Diagnosis not present

## 2014-04-12 DIAGNOSIS — W1839XA Other fall on same level, initial encounter: Secondary | ICD-10-CM | POA: Diagnosis not present

## 2014-04-12 DIAGNOSIS — S3992XA Unspecified injury of lower back, initial encounter: Secondary | ICD-10-CM | POA: Insufficient documentation

## 2014-04-12 DIAGNOSIS — I739 Peripheral vascular disease, unspecified: Secondary | ICD-10-CM | POA: Diagnosis not present

## 2014-04-12 DIAGNOSIS — R630 Anorexia: Secondary | ICD-10-CM | POA: Diagnosis not present

## 2014-04-12 DIAGNOSIS — Z79899 Other long term (current) drug therapy: Secondary | ICD-10-CM | POA: Diagnosis not present

## 2014-04-12 DIAGNOSIS — W19XXXA Unspecified fall, initial encounter: Secondary | ICD-10-CM

## 2014-04-12 DIAGNOSIS — R609 Edema, unspecified: Secondary | ICD-10-CM | POA: Insufficient documentation

## 2014-04-12 DIAGNOSIS — J9811 Atelectasis: Secondary | ICD-10-CM | POA: Diagnosis not present

## 2014-04-12 DIAGNOSIS — J8410 Pulmonary fibrosis, unspecified: Secondary | ICD-10-CM | POA: Diagnosis not present

## 2014-04-12 DIAGNOSIS — I10 Essential (primary) hypertension: Secondary | ICD-10-CM | POA: Insufficient documentation

## 2014-04-12 DIAGNOSIS — Y9389 Activity, other specified: Secondary | ICD-10-CM | POA: Diagnosis not present

## 2014-04-12 DIAGNOSIS — M549 Dorsalgia, unspecified: Secondary | ICD-10-CM

## 2014-04-12 DIAGNOSIS — Y9289 Other specified places as the place of occurrence of the external cause: Secondary | ICD-10-CM | POA: Diagnosis not present

## 2014-04-12 LAB — COMPREHENSIVE METABOLIC PANEL
ALT: 7 U/L (ref 0–53)
ANION GAP: 15 (ref 5–15)
AST: 12 U/L (ref 0–37)
Albumin: 3.4 g/dL — ABNORMAL LOW (ref 3.5–5.2)
Alkaline Phosphatase: 34 U/L — ABNORMAL LOW (ref 39–117)
BILIRUBIN TOTAL: 0.9 mg/dL (ref 0.3–1.2)
BUN: 43 mg/dL — AB (ref 6–23)
CALCIUM: 9.2 mg/dL (ref 8.4–10.5)
CHLORIDE: 105 meq/L (ref 96–112)
CO2: 21 mEq/L (ref 19–32)
Creatinine, Ser: 1.68 mg/dL — ABNORMAL HIGH (ref 0.50–1.35)
GFR calc Af Amer: 38 mL/min — ABNORMAL LOW (ref >90)
GFR, EST NON AFRICAN AMERICAN: 33 mL/min — AB (ref >90)
Glucose, Bld: 107 mg/dL — ABNORMAL HIGH (ref 70–99)
Potassium: 3.8 mEq/L (ref 3.7–5.3)
Sodium: 141 mEq/L (ref 137–147)
Total Protein: 6.9 g/dL (ref 6.0–8.3)

## 2014-04-12 LAB — URINE MICROSCOPIC-ADD ON

## 2014-04-12 LAB — URINALYSIS, ROUTINE W REFLEX MICROSCOPIC
Bilirubin Urine: NEGATIVE
Glucose, UA: NEGATIVE mg/dL
KETONES UR: NEGATIVE mg/dL
LEUKOCYTES UA: NEGATIVE
Nitrite: NEGATIVE
PROTEIN: NEGATIVE mg/dL
Specific Gravity, Urine: 1.015 (ref 1.005–1.030)
Urobilinogen, UA: 0.2 mg/dL (ref 0.0–1.0)
pH: 5.5 (ref 5.0–8.0)

## 2014-04-12 LAB — CBC WITH DIFFERENTIAL/PLATELET
BASOS PCT: 0 % (ref 0–1)
Basophils Absolute: 0 10*3/uL (ref 0.0–0.1)
EOS PCT: 0 % (ref 0–5)
Eosinophils Absolute: 0 10*3/uL (ref 0.0–0.7)
HEMATOCRIT: 27.6 % — AB (ref 39.0–52.0)
Hemoglobin: 9.5 g/dL — ABNORMAL LOW (ref 13.0–17.0)
Lymphocytes Relative: 8 % — ABNORMAL LOW (ref 12–46)
Lymphs Abs: 1.2 10*3/uL (ref 0.7–4.0)
MCH: 32.9 pg (ref 26.0–34.0)
MCHC: 34.4 g/dL (ref 30.0–36.0)
MCV: 95.5 fL (ref 78.0–100.0)
MONO ABS: 2.8 10*3/uL — AB (ref 0.1–1.0)
MONOS PCT: 18 % — AB (ref 3–12)
NEUTROS ABS: 11.8 10*3/uL — AB (ref 1.7–7.7)
Neutrophils Relative %: 74 % (ref 43–77)
Platelets: 149 10*3/uL — ABNORMAL LOW (ref 150–400)
RBC: 2.89 MIL/uL — ABNORMAL LOW (ref 4.22–5.81)
RDW: 14.1 % (ref 11.5–15.5)
WBC: 15.8 10*3/uL — ABNORMAL HIGH (ref 4.0–10.5)

## 2014-04-12 NOTE — ED Notes (Signed)
Pt daughter states pt fell this morning around 0800. Pt reports pain inbetween his shoulder blades more to the left side, however pt states that was hurting before he fell. Pt states he felt bad this morning before he fell. Pt daughter believes he has "gas." Pt alert oriented x 4, able to follow commands. Pt lower extremities swollen with +3 pitting edema

## 2014-04-12 NOTE — ED Notes (Signed)
Pt o2 saturation dropping into upper 80's on RA. 1 L O2 applied via nasal cannula. Will monitor.

## 2014-04-12 NOTE — ED Notes (Signed)
Ambulated pt with PA. Pt able to shuffle with assistance.

## 2014-04-12 NOTE — Discharge Instructions (Signed)
Fall Prevention and Home Safety Falls cause injuries and can affect all age groups. It is possible to use preventive measures to significantly decrease the likelihood of falls. There are many simple measures which can make your home safer and prevent falls. OUTDOORS  Repair cracks and edges of walkways and driveways.  Remove high doorway thresholds.  Trim shrubbery on the main path into your home.  Have good outside lighting.  Clear walkways of tools, rocks, debris, and clutter.  Check that handrails are not broken and are securely fastened. Both sides of steps should have handrails.  Have leaves, snow, and ice cleared regularly.  Use sand or salt on walkways during winter months.  In the garage, clean up grease or oil spills. BATHROOM  Install night lights.  Install grab bars by the toilet and in the tub and shower.  Use non-skid mats or decals in the tub or shower.  Place a plastic non-slip stool in the shower to sit on, if needed.  Keep floors dry and clean up all water on the floor immediately.  Remove soap buildup in the tub or shower on a regular basis.  Secure bath mats with non-slip, double-sided rug tape.  Remove throw rugs and tripping hazards from the floors. BEDROOMS  Install night lights.  Make sure a bedside light is easy to reach.  Do not use oversized bedding.  Keep a telephone by your bedside.  Have a firm chair with side arms to use for getting dressed.  Remove throw rugs and tripping hazards from the floor. KITCHEN  Keep handles on pots and pans turned toward the center of the stove. Use back burners when possible.  Clean up spills quickly and allow time for drying.  Avoid walking on wet floors.  Avoid hot utensils and knives.  Position shelves so they are not too high or low.  Place commonly used objects within easy reach.  If necessary, use a sturdy step stool with a grab bar when reaching.  Keep electrical cables out of the  way.  Do not use floor polish or wax that makes floors slippery. If you must use wax, use non-skid floor wax.  Remove throw rugs and tripping hazards from the floor. STAIRWAYS  Never leave objects on stairs.  Place handrails on both sides of stairways and use them. Fix any loose handrails. Make sure handrails on both sides of the stairways are as long as the stairs.  Check carpeting to make sure it is firmly attached along stairs. Make repairs to worn or loose carpet promptly.  Avoid placing throw rugs at the top or bottom of stairways, or properly secure the rug with carpet tape to prevent slippage. Get rid of throw rugs, if possible.  Have an electrician put in a light switch at the top and bottom of the stairs. OTHER FALL PREVENTION TIPS  Wear low-heel or rubber-soled shoes that are supportive and fit well. Wear closed toe shoes.  When using a stepladder, make sure it is fully opened and both spreaders are firmly locked. Do not climb a closed stepladder.  Add color or contrast paint or tape to grab bars and handrails in your home. Place contrasting color strips on first and last steps.  Learn and use mobility aids as needed. Install an electrical emergency response system.  Turn on lights to avoid dark areas. Replace light bulbs that burn out immediately. Get light switches that glow.  Arrange furniture to create clear pathways. Keep furniture in the same place.  Firmly attach carpet with non-skid or double-sided tape.  Eliminate uneven floor surfaces.  Select a carpet pattern that does not visually hide the edge of steps.  Be aware of all pets. OTHER HOME SAFETY TIPS  Set the water temperature for 120 F (48.8 C).  Keep emergency numbers on or near the telephone.  Keep smoke detectors on every level of the home and near sleeping areas. Document Released: 04/11/2002 Document Revised: 10/21/2011 Document Reviewed: 07/11/2011 [facility] CareExitCare Patient Information 2015  [place], [place]. This information is not intended to replace advice given to you by your health care provider. Make sure you discuss any questions you have with your health care provider. Edema Edema is an abnormal buildup of fluids in your bodytissues. Edema is somewhatdependent on gravity to pull the fluid to the lowest place in your body. That makes the condition more common in the legs and thighs (lower extremities). Painless swelling of the feet and ankles is common and becomes more likely as you get older. It is also common in looser tissues, like around your eyes.  When the affected area is squeezed, the fluid may move out of that spot and leave a dent for a few moments. This dent is called pitting.  CAUSES  There are many possible causes of edema. Eating too much salt and being on your feet or sitting for a long time can cause edema in your legs and ankles. Hot weather may make edema worse. Common medical causes of edema include:  Heart failure.  Liver disease.  Kidney disease.  Weak blood vessels in your legs.  Cancer.  An injury.  Pregnancy.  Some medications.  Obesity. SYMPTOMS  Edema is usually painless.Your skin may look swollen or shiny.  DIAGNOSIS  Your health care provider may be able to diagnose edema by asking about your medical history and doing a physical exam. You may need to have tests such as X-rays, an electrocardiogram, or blood tests to check for medical conditions that may cause edema.  TREATMENT  Edema treatment depends on the cause. If you have heart, liver, or kidney disease, you need the treatment appropriate for these conditions. General treatment may include:  Elevation of the affected body part above the level of your heart.  Compression of the affected body part. Pressure from elastic bandages or support stockings squeezes the tissues and forces fluid back into the blood vessels. This keeps fluid from entering the tissues.  Restriction of fluid  and salt intake.  Use of a water pill (diuretic). These medications are appropriate only for some types of edema. They pull fluid out of your body and make you urinate more often. This gets rid of fluid and reduces swelling, but diuretics can have side effects. Only use diuretics as directed by your health care provider. HOME CARE INSTRUCTIONS   Keep the affected body part above the level of your heart when you are lying down.   Do not sit still or stand for prolonged periods.   Do not put anything directly under your knees when lying down.  Do not wear constricting clothing or garters on your upper legs.   Exercise your legs to work the fluid back into your blood vessels. This may help the swelling go down.   Wear elastic bandages or support stockings to reduce ankle swelling as directed by your health care provider.   Eat a low-salt diet to reduce fluid if your health care provider recommends it.   Only take medicines as directed by  your health care provider. SEEK MEDICAL CARE IF:   Your edema is not responding to treatment.  You have heart, liver, or kidney disease and notice symptoms of edema.  You have edema in your legs that does not improve after elevating them.   You have sudden and unexplained weight gain. SEEK IMMEDIATE MEDICAL CARE IF:   You develop shortness of breath or chest pain.   You cannot breathe when you lie down.  You develop pain, redness, or warmth in the swollen areas.   You have heart, liver, or kidney disease and suddenly get edema.  You have a fever and your symptoms suddenly get worse. MAKE SURE YOU:   Understand these instructions.  Will watch your condition.  Will get help right away if you are not doing well or get worse. Document Released: 04/21/2005 Document Revised: 09/05/2013 Document Reviewed: 02/11/2013 [facility] LPExitCare Patient Information 2015 [place], [place]. This information is not intended to replace advice given to you by  your health care provider. Make sure you discuss any questions you have with your health care provider.

## 2014-04-12 NOTE — ED Provider Notes (Signed)
CSN: [id number]     Arrival date & time 04/12/14  0907 History   First MD Initiated Contact with Patient 04/12/14 1014     Chief Complaint  Patient presents with  . Fall     (Consider location/radiation/quality/duration/timing/severity/associated sxs/prior Treatment) Patient is a 80 y.o. male presenting with fall. The history is provided by the patient. No language interpreter was used.  Fall This is a new problem. The current episode started today. The problem occurs constantly. The problem has been unchanged. Pertinent negatives include no abdominal pain. Nothing aggravates the symptoms. He has tried nothing for the symptoms. The treatment provided mild relief.  Pt has fallen 3 times in the past 3 weeks.  Family concerned because pt's feet are swollen.  Pt reports decreased appetite   Past Medical History  Diagnosis Date  . Hypertension   . CHF (congestive heart failure)   . Shortness of breath    Past Surgical History  Procedure Laterality Date  . Bladder surgery      had tumor removed 20 yrs ago   History reviewed. No pertinent family history. History  Substance Use Topics  . Smoking status: Former Smoker -- 0.30 packs/day for 25 years    Types: Cigarettes    Quit date: 05/06/1971  . Smokeless tobacco: Never Used  . Alcohol Use: No    Review of Systems  Gastrointestinal: Negative for abdominal pain.  All other systems reviewed and are negative.     Allergies  Contrast media  Home Medications   Prior to Admission medications   Medication Sig Start Date End Date Taking? Authorizing Provider  diclofenac sodium (VOLTAREN) 1 % GEL Apply 2 g topically daily as needed (Arthritis pain).   Yes Historical Provider, MD  furosemide (LASIX) 20 MG tablet Take 20 mg by mouth 2 (two) times daily.  02/21/14  Yes Historical Provider, MD  losartan-hydrochlorothiazide (HYZAAR) 100-25 MG per tablet Take 1 tablet by mouth daily.   Yes Historical Provider, MD  simvastatin (ZOCOR) 40  MG tablet Take 40 mg by mouth every morning.   Yes Historical Provider, MD  feeding supplement (ENSURE CLINICAL STRENGTH) LIQD Take 237 mLs by mouth 3 (three) times daily between meals. 06/26/11   Sorin Luanne Bras Laza, MD  saccharomyces boulardii (FLORASTOR) 250 MG capsule Take 1 capsule (250 mg total) by mouth 2 (two) times daily. Patient not taking: Reported on 04/12/2014 05/04/13   Kela MillinAdeline C Viyuoh, MD  tamsulosin (FLOMAX) 0.4 MG CAPS capsule Take 1 capsule (0.4 mg total) by mouth daily after supper. Patient not taking: Reported on 04/12/2014 05/04/13   Kela MillinAdeline C Viyuoh, MD   BP 126/53 mmHg  Pulse 97  Temp(Src) 97.6 F (36.4 C) (Oral)  Resp 26  SpO2 100% Physical Exam  Constitutional: He appears well-developed and well-nourished.  HENT:  Head: Normocephalic and atraumatic.  Right Ear: External ear normal.  Left Ear: External ear normal.  Nose: Nose normal.  Mouth/Throat: Oropharynx is clear and moist.  Eyes: Conjunctivae and EOM are normal. Pupils are equal, round, and reactive to light.  Neck: Normal range of motion. Neck supple.  Cardiovascular: Normal rate, regular rhythm and normal heart sounds.   Pulmonary/Chest: He is in respiratory distress.  Abdominal: Soft.  Musculoskeletal: Normal range of motion.  Pt ambulates with assistance  Neurological: He is alert.  Skin: Skin is warm.  Psychiatric: He has a normal mood and affect.  Nursing note and vitals reviewed.   ED Course  Procedures (including critical care time) Labs Review  Labs Reviewed  CBC WITH DIFFERENTIAL - Abnormal; Notable for the following:    WBC 15.8 (*)    RBC 2.89 (*)    Hemoglobin 9.5 (*)    HCT 27.6 (*)    Platelets 149 (*)    Neutro Abs 11.8 (*)    Lymphocytes Relative 8 (*)    Monocytes Relative 18 (*)    Monocytes Absolute 2.8 (*)    All other components within normal limits  COMPREHENSIVE METABOLIC PANEL - Abnormal; Notable for the following:    Glucose, Bld 107 (*)    BUN 43 (*)    Creatinine, Ser  1.68 (*)    Albumin 3.4 (*)    Alkaline Phosphatase 34 (*)    GFR calc non Af Amer 33 (*)    GFR calc Af Amer 38 (*)    All other components within normal limits  URINALYSIS, ROUTINE W REFLEX MICROSCOPIC - Abnormal; Notable for the following:    Hgb urine dipstick SMALL (*)    All other components within normal limits  URINE MICROSCOPIC-ADD ON    Imaging Review No results found.   EKG Interpretation   Date/Time:  Wednesday April 12 2014 09:24:03 EST Ventricular Rate:  101 PR Interval:  135 QRS Duration: 84 QT Interval:  398 QTC Calculation: 516 R Axis:   82 Text Interpretation:  Sinus tachycardia Borderline right axis deviation  Prolonged QT interval Confirmed by ZACKOWSKI  MD, SCOTT ([id number]) on  04/12/2014 11:12:12 AM      MDM  Pt wants to go home.  I counseled pt and family on need to follow up with Dr. Parke SimmersBland.   Final diagnoses:  Pain in back  [address]        Brittiany Wiehe K [place], [place] 04/12/14 1517  Vanetta MuldersScott Zackowski, MD 04/13/14 [phone]

## 2014-05-03 DIAGNOSIS — R627 Adult failure to thrive: Secondary | ICD-10-CM | POA: Diagnosis not present

## 2014-05-03 DIAGNOSIS — I10 Essential (primary) hypertension: Secondary | ICD-10-CM | POA: Diagnosis not present

## 2014-06-09 DIAGNOSIS — R609 Edema, unspecified: Secondary | ICD-10-CM | POA: Diagnosis not present

## 2014-06-13 DIAGNOSIS — E782 Mixed hyperlipidemia: Secondary | ICD-10-CM | POA: Diagnosis not present

## 2014-06-13 DIAGNOSIS — R609 Edema, unspecified: Secondary | ICD-10-CM | POA: Diagnosis not present

## 2014-06-28 DIAGNOSIS — I10 Essential (primary) hypertension: Secondary | ICD-10-CM | POA: Diagnosis not present

## 2014-06-28 DIAGNOSIS — R600 Localized edema: Secondary | ICD-10-CM | POA: Diagnosis not present

## 2014-08-01 DIAGNOSIS — I10 Essential (primary) hypertension: Secondary | ICD-10-CM | POA: Diagnosis not present

## 2014-08-02 ENCOUNTER — Ambulatory Visit (INDEPENDENT_AMBULATORY_CARE_PROVIDER_SITE_OTHER): Payer: Medicare Other | Admitting: Podiatry

## 2014-08-02 ENCOUNTER — Encounter: Payer: Self-pay | Admitting: Podiatry

## 2014-08-02 VITALS — BP 130/86 | HR 86 | Resp 12

## 2014-08-02 DIAGNOSIS — B351 Tinea unguium: Secondary | ICD-10-CM

## 2014-08-02 DIAGNOSIS — M79676 Pain in unspecified toe(s): Secondary | ICD-10-CM

## 2014-08-02 NOTE — Progress Notes (Signed)
   Subjective:    Patient ID: Marcus Clark, male    DOB: Jun 20, 1915, 81 y.o.   MRN: [id number]  HPI N-THICK, DISCOLORATION, SORE L-B/L TOENAILS O-SLOWLY D-3 WEEKS C-WORSE A-PRESSURE T-TRIM   There is no history of podiatric care  Review of Systems  Constitutional: Positive for fatigue.  Respiratory: Positive for shortness of breath.   Musculoskeletal: Positive for gait problem.  All other systems reviewed and are negative.      Objective:   Physical Exam  Patient presents with daughter in treatment room has difficulty responding to direct questioning.   vascular: +4 pitting edema ankles bilaterally DP pulses 0/4 bilaterally PT pulses 1/4 bilaterally  Neurological: Ankle reflexes reactive bilaterally Vibratory sensation patient not able to respond Sensation to 10 g monofilament wire patient not able to respond  Dermatological: The toenails are extremely elongated, hypertrophic, incurvated, brittle and tender to direct palpation 6-10 No skin lesions noted bilaterally  Musculoskeletal: There is no restriction ankle, subtalar, midtarsal joints bilaterally       Assessment & Plan:   Assessment: Edema bilaterally Diminished pedal pulses may suggest a peripheral arterial disease or difficulty palpating because of pitting edema Symptomatic onychomycoses 6-10  Plan: Debrided toenails 10 without any bleeding  Reappoint at patient's request

## 2014-08-31 DIAGNOSIS — Z7409 Other reduced mobility: Secondary | ICD-10-CM | POA: Diagnosis not present

## 2014-09-05 DIAGNOSIS — R627 Adult failure to thrive: Secondary | ICD-10-CM | POA: Diagnosis not present

## 2014-09-05 DIAGNOSIS — I10 Essential (primary) hypertension: Secondary | ICD-10-CM | POA: Diagnosis not present

## 2014-09-05 DIAGNOSIS — F0390 Unspecified dementia without behavioral disturbance: Secondary | ICD-10-CM | POA: Diagnosis not present

## 2014-09-05 DIAGNOSIS — F419 Anxiety disorder, unspecified: Secondary | ICD-10-CM | POA: Diagnosis not present

## 2014-09-05 DIAGNOSIS — R296 Repeated falls: Secondary | ICD-10-CM | POA: Diagnosis not present

## 2014-09-05 DIAGNOSIS — M6281 Muscle weakness (generalized): Secondary | ICD-10-CM | POA: Diagnosis not present

## 2014-09-05 DIAGNOSIS — F329 Major depressive disorder, single episode, unspecified: Secondary | ICD-10-CM | POA: Diagnosis not present

## 2014-09-05 DIAGNOSIS — Z9181 History of falling: Secondary | ICD-10-CM | POA: Diagnosis not present

## 2014-09-07 DIAGNOSIS — M6281 Muscle weakness (generalized): Secondary | ICD-10-CM | POA: Diagnosis not present

## 2014-09-07 DIAGNOSIS — F0390 Unspecified dementia without behavioral disturbance: Secondary | ICD-10-CM | POA: Diagnosis not present

## 2014-09-07 DIAGNOSIS — F419 Anxiety disorder, unspecified: Secondary | ICD-10-CM | POA: Diagnosis not present

## 2014-09-07 DIAGNOSIS — I10 Essential (primary) hypertension: Secondary | ICD-10-CM | POA: Diagnosis not present

## 2014-09-07 DIAGNOSIS — F329 Major depressive disorder, single episode, unspecified: Secondary | ICD-10-CM | POA: Diagnosis not present

## 2014-09-07 DIAGNOSIS — R296 Repeated falls: Secondary | ICD-10-CM | POA: Diagnosis not present

## 2014-09-14 DIAGNOSIS — M6281 Muscle weakness (generalized): Secondary | ICD-10-CM | POA: Diagnosis not present

## 2014-09-14 DIAGNOSIS — R296 Repeated falls: Secondary | ICD-10-CM | POA: Diagnosis not present

## 2014-09-14 DIAGNOSIS — F329 Major depressive disorder, single episode, unspecified: Secondary | ICD-10-CM | POA: Diagnosis not present

## 2014-09-14 DIAGNOSIS — I10 Essential (primary) hypertension: Secondary | ICD-10-CM | POA: Diagnosis not present

## 2014-09-14 DIAGNOSIS — F419 Anxiety disorder, unspecified: Secondary | ICD-10-CM | POA: Diagnosis not present

## 2014-09-14 DIAGNOSIS — F0390 Unspecified dementia without behavioral disturbance: Secondary | ICD-10-CM | POA: Diagnosis not present

## 2014-09-19 DIAGNOSIS — I10 Essential (primary) hypertension: Secondary | ICD-10-CM | POA: Diagnosis not present

## 2014-09-19 DIAGNOSIS — M6281 Muscle weakness (generalized): Secondary | ICD-10-CM | POA: Diagnosis not present

## 2014-09-19 DIAGNOSIS — R296 Repeated falls: Secondary | ICD-10-CM | POA: Diagnosis not present

## 2014-09-19 DIAGNOSIS — F0390 Unspecified dementia without behavioral disturbance: Secondary | ICD-10-CM | POA: Diagnosis not present

## 2014-09-19 DIAGNOSIS — F329 Major depressive disorder, single episode, unspecified: Secondary | ICD-10-CM | POA: Diagnosis not present

## 2014-09-19 DIAGNOSIS — F419 Anxiety disorder, unspecified: Secondary | ICD-10-CM | POA: Diagnosis not present

## 2014-09-27 DIAGNOSIS — I10 Essential (primary) hypertension: Secondary | ICD-10-CM | POA: Diagnosis not present

## 2014-09-27 DIAGNOSIS — R296 Repeated falls: Secondary | ICD-10-CM | POA: Diagnosis not present

## 2014-09-27 DIAGNOSIS — M6281 Muscle weakness (generalized): Secondary | ICD-10-CM | POA: Diagnosis not present

## 2014-09-27 DIAGNOSIS — F329 Major depressive disorder, single episode, unspecified: Secondary | ICD-10-CM | POA: Diagnosis not present

## 2014-09-27 DIAGNOSIS — F0390 Unspecified dementia without behavioral disturbance: Secondary | ICD-10-CM | POA: Diagnosis not present

## 2014-09-27 DIAGNOSIS — F419 Anxiety disorder, unspecified: Secondary | ICD-10-CM | POA: Diagnosis not present

## 2014-10-04 DIAGNOSIS — R601 Generalized edema: Secondary | ICD-10-CM | POA: Diagnosis not present

## 2014-10-04 DIAGNOSIS — Z7409 Other reduced mobility: Secondary | ICD-10-CM | POA: Diagnosis not present

## 2014-10-05 DIAGNOSIS — I10 Essential (primary) hypertension: Secondary | ICD-10-CM | POA: Diagnosis not present

## 2014-10-05 DIAGNOSIS — F329 Major depressive disorder, single episode, unspecified: Secondary | ICD-10-CM | POA: Diagnosis not present

## 2014-10-05 DIAGNOSIS — F419 Anxiety disorder, unspecified: Secondary | ICD-10-CM | POA: Diagnosis not present

## 2014-10-05 DIAGNOSIS — M6281 Muscle weakness (generalized): Secondary | ICD-10-CM | POA: Diagnosis not present

## 2014-10-05 DIAGNOSIS — R296 Repeated falls: Secondary | ICD-10-CM | POA: Diagnosis not present

## 2014-10-05 DIAGNOSIS — F0390 Unspecified dementia without behavioral disturbance: Secondary | ICD-10-CM | POA: Diagnosis not present

## 2014-11-03 DIAGNOSIS — R634 Abnormal weight loss: Secondary | ICD-10-CM | POA: Diagnosis not present

## 2014-11-08 ENCOUNTER — Encounter: Payer: Self-pay | Admitting: Podiatry

## 2014-11-08 ENCOUNTER — Ambulatory Visit (INDEPENDENT_AMBULATORY_CARE_PROVIDER_SITE_OTHER): Payer: Medicare Other | Admitting: Podiatry

## 2014-11-08 DIAGNOSIS — M79676 Pain in unspecified toe(s): Secondary | ICD-10-CM | POA: Diagnosis not present

## 2014-11-08 DIAGNOSIS — B351 Tinea unguium: Secondary | ICD-10-CM | POA: Diagnosis not present

## 2014-11-08 NOTE — Progress Notes (Signed)
   Subjective:    Patient ID: Marcus Clark, male    DOB: Aug 19, 1915, 81 y.o.   MRN: [id number]  HPI "He wanted him to come back to check his toenails.  I want him to remove his left big toenail and the toe next to it." This patient presents today office today with his wife complaining of painful toenails and is requesting removal of the left great toenail. Denies any infection in the left hallux nail   Review of Systems     Objective:   Physical Exam  Patient's daughter is in the treatment room today Vascular: Pitting edema ankles bilaterally DP pulses 0/4 bilaterally PT pulses 1/4 bilaterally  Dermatological: The toenails are elongated, brittle, hypertrophic, incurvated including the left hallux nail. The nails are tender to direct palpation 6-10      Assessment & Plan:   Assessment: Peripheral edema bilaterally Diminished pedal pulses may be suggestive peripheral arterial disease Symptomatic onychomycoses 6-10  Plan: I reviewed the results of exam today and recommended that nail avulsion was not indicated primarily because of decreased pedal pulses and no clinical sign of bacterial infection. I recommended repetitive debridement of the mycotic toenails. The patient and his daughter consent  The toenails 10 are debrided mechanically and electrically without any bleeding  Reappoint as needed or at three-month intervals

## 2015-01-02 DIAGNOSIS — R634 Abnormal weight loss: Secondary | ICD-10-CM | POA: Diagnosis not present

## 2015-02-07 ENCOUNTER — Encounter: Payer: Self-pay | Admitting: Podiatry

## 2015-02-07 ENCOUNTER — Ambulatory Visit (INDEPENDENT_AMBULATORY_CARE_PROVIDER_SITE_OTHER): Payer: Medicare Other | Admitting: Podiatry

## 2015-02-07 DIAGNOSIS — M79676 Pain in unspecified toe(s): Secondary | ICD-10-CM

## 2015-02-07 DIAGNOSIS — B351 Tinea unguium: Secondary | ICD-10-CM

## 2015-02-07 NOTE — Progress Notes (Signed)
Patient ID: Marcus Clark, male   DOB: 1915-09-19, 81 y.o.   MRN: [id number]  Subjective: This patient presents for scheduled visit complaining of painful toenails walking wearing shoes and requesting nail debridement  Objective: No open skin lesions bilaterally The toenails are hypertrophic elongated, incurvated, discolored and tender to direct palpation 6-10  Assessment: Symptomatic onychomycoses 6-10  Plan: Debrided toenails 10 mechanically and laterally. Small bleeding distal fourth right toe treated with topical antibiotic ointment and Band-Aid. Patient advised to apply topical antibiotic ointment and Band-Aid after removal of dressing 1-2 days until a scab forms  Reappoint 3 months

## 2015-02-07 NOTE — Patient Instructions (Signed)
There was a small amount of bleeding at the end of the fourth right toe after trimming the nail. Removed bandage on fourth right toe 1-2 days and apply topical antibiotic ointment daily until a scab forms

## 2015-03-05 DIAGNOSIS — F5089 Other specified eating disorder: Secondary | ICD-10-CM | POA: Diagnosis not present

## 2015-03-05 DIAGNOSIS — Z23 Encounter for immunization: Secondary | ICD-10-CM | POA: Diagnosis not present

## 2015-05-10 DIAGNOSIS — I10 Essential (primary) hypertension: Secondary | ICD-10-CM | POA: Diagnosis not present

## 2015-05-10 DIAGNOSIS — F5089 Other specified eating disorder: Secondary | ICD-10-CM | POA: Diagnosis not present

## 2015-05-22 ENCOUNTER — Encounter: Payer: Self-pay | Admitting: Podiatry

## 2015-05-22 ENCOUNTER — Ambulatory Visit (INDEPENDENT_AMBULATORY_CARE_PROVIDER_SITE_OTHER): Payer: Medicare Other | Admitting: Podiatry

## 2015-05-22 DIAGNOSIS — B351 Tinea unguium: Secondary | ICD-10-CM | POA: Diagnosis not present

## 2015-05-22 DIAGNOSIS — M79676 Pain in unspecified toe(s): Secondary | ICD-10-CM | POA: Diagnosis not present

## 2015-05-22 NOTE — Progress Notes (Signed)
Patient ID: Marcus Clark, male   DOB: 05-Jan-1916, 82 y.o.   MRN: [id number]  Subjective: This patient presents complaining of uncomfortable elongated and thickened toenails when walking wearing shoes and request nail debridement  Patient appears orientated 3 and responds with daughter present to treatment room  Objective: No open skin lesions bilaterally The toenails are hypertrophic, incurvated, elongated, deformed and tender to direct palpation 6-10  Assessment: Symptomatic onychomycoses 6-10  Plan: Debrided toenails 6-10 mechanically and electronically without any bleeding  Reappoint 3

## 2015-08-08 DIAGNOSIS — Z6820 Body mass index (BMI) 20.0-20.9, adult: Secondary | ICD-10-CM | POA: Diagnosis not present

## 2015-08-08 DIAGNOSIS — R634 Abnormal weight loss: Secondary | ICD-10-CM | POA: Diagnosis not present

## 2015-08-08 DIAGNOSIS — R600 Localized edema: Secondary | ICD-10-CM | POA: Diagnosis not present

## 2015-08-08 DIAGNOSIS — I10 Essential (primary) hypertension: Secondary | ICD-10-CM | POA: Diagnosis not present

## 2015-08-21 ENCOUNTER — Ambulatory Visit (INDEPENDENT_AMBULATORY_CARE_PROVIDER_SITE_OTHER): Payer: Medicare Other | Admitting: Podiatry

## 2015-08-21 ENCOUNTER — Encounter: Payer: Self-pay | Admitting: Podiatry

## 2015-08-21 DIAGNOSIS — M79676 Pain in unspecified toe(s): Secondary | ICD-10-CM

## 2015-08-21 DIAGNOSIS — B351 Tinea unguium: Secondary | ICD-10-CM

## 2015-08-21 NOTE — Progress Notes (Signed)
Patient ID: Marcus Clark, male   DOB: 03/21/1916, 82 y.o.   MRN: [id number]  Subjective: This patient presents for scheduled visit complaining of painful toenails walking wearing shoes and requesting nail debridement. The patient's daughter is present in the treatment room today  Objective: No open skin lesions bilaterally The toenails are hypertrophic elongated, incurvated, discolored and tender to direct palpation 6-10  Assessment: Symptomatic onychomycoses 6-10  Plan: Debrided toenails 10 mechanically and electrically without any bleeding  Reappoint 3 months            

## 2015-11-09 DIAGNOSIS — D509 Iron deficiency anemia, unspecified: Secondary | ICD-10-CM | POA: Diagnosis not present

## 2015-11-09 DIAGNOSIS — R262 Difficulty in walking, not elsewhere classified: Secondary | ICD-10-CM | POA: Diagnosis not present

## 2015-11-09 DIAGNOSIS — I10 Essential (primary) hypertension: Secondary | ICD-10-CM | POA: Diagnosis not present

## 2015-11-09 DIAGNOSIS — E538 Deficiency of other specified B group vitamins: Secondary | ICD-10-CM | POA: Diagnosis not present

## 2015-11-09 DIAGNOSIS — Z681 Body mass index (BMI) 19 or less, adult: Secondary | ICD-10-CM | POA: Diagnosis not present

## 2015-11-09 DIAGNOSIS — N189 Chronic kidney disease, unspecified: Secondary | ICD-10-CM | POA: Diagnosis not present

## 2015-11-20 ENCOUNTER — Encounter: Payer: Self-pay | Admitting: Podiatry

## 2015-11-20 ENCOUNTER — Ambulatory Visit (INDEPENDENT_AMBULATORY_CARE_PROVIDER_SITE_OTHER): Payer: Medicare Other | Admitting: Podiatry

## 2015-11-20 DIAGNOSIS — B351 Tinea unguium: Secondary | ICD-10-CM | POA: Diagnosis not present

## 2015-11-20 DIAGNOSIS — M79676 Pain in unspecified toe(s): Secondary | ICD-10-CM | POA: Diagnosis not present

## 2015-11-20 NOTE — Progress Notes (Signed)
Patient ID: Marcus Clark, male   DOB: [birth year], 82 y.o.   MRN: [id number]  Subjective: This patient presents for scheduled visit complaining of painful toenails walking wearing shoes and requesting nail debridement. The patient's daughter is present in the treatment room today  Objective: No open skin lesions bilaterally The toenails are hypertrophic elongated, incurvated, discolored and tender to direct palpation 6-10  Assessment: Symptomatic onychomycoses 6-10  Plan: Debrided toenails 10 mechanically and electrically without any bleeding  Reappoint 3 months

## 2015-12-14 DIAGNOSIS — R600 Localized edema: Secondary | ICD-10-CM | POA: Diagnosis not present

## 2015-12-14 DIAGNOSIS — R262 Difficulty in walking, not elsewhere classified: Secondary | ICD-10-CM | POA: Diagnosis not present

## 2015-12-14 DIAGNOSIS — F5089 Other specified eating disorder: Secondary | ICD-10-CM | POA: Diagnosis not present

## 2015-12-14 DIAGNOSIS — R350 Frequency of micturition: Secondary | ICD-10-CM | POA: Diagnosis not present

## 2016-02-14 DIAGNOSIS — Z23 Encounter for immunization: Secondary | ICD-10-CM | POA: Diagnosis not present

## 2016-02-14 DIAGNOSIS — I10 Essential (primary) hypertension: Secondary | ICD-10-CM | POA: Diagnosis not present

## 2016-02-14 DIAGNOSIS — Z681 Body mass index (BMI) 19 or less, adult: Secondary | ICD-10-CM | POA: Diagnosis not present

## 2016-02-14 DIAGNOSIS — F4320 Adjustment disorder, unspecified: Secondary | ICD-10-CM | POA: Diagnosis not present

## 2016-02-26 ENCOUNTER — Ambulatory Visit (INDEPENDENT_AMBULATORY_CARE_PROVIDER_SITE_OTHER): Payer: Medicare Other | Admitting: Podiatry

## 2016-02-26 ENCOUNTER — Encounter: Payer: Self-pay | Admitting: Podiatry

## 2016-02-26 VITALS — BP 133/73 | HR 89 | Resp 14

## 2016-02-26 DIAGNOSIS — M79676 Pain in unspecified toe(s): Secondary | ICD-10-CM

## 2016-02-26 DIAGNOSIS — B351 Tinea unguium: Secondary | ICD-10-CM | POA: Diagnosis not present

## 2016-02-26 NOTE — Progress Notes (Signed)
Patient ID: Marcus SilkJames W Schetter, male   DOB: Aug 15, 1915, 82 y.o.   MRN: [id number]    Subjective: This patient presents for scheduled visit complaining of painful toenails walking wearing shoes and requesting nail debridement. The patient's daughter is present in the treatment room today  Objective: Patient is responsive and appears orientated 3 Bilateral peripheral pitting edema Trace DP and PT pulses bilaterally Capillary reflex immediate bilaterally Dorsi flexion, plantar flexion 5/5 bilaterally No open skin lesions bilaterally The toenails are hypertrophic elongated, incurvated, discolored and tender to direct palpation 6-10  Assessment: Symptomatic onychomycoses 6-10 Diminished pedal pulses Peripheral edema (under treatment per patient's daughter)  Plan: Debrided toenails 10 mechanically and electrically without any bleeding  Reappoint 3 months

## 2016-05-27 ENCOUNTER — Encounter: Payer: Self-pay | Admitting: Podiatry

## 2016-05-27 ENCOUNTER — Ambulatory Visit (INDEPENDENT_AMBULATORY_CARE_PROVIDER_SITE_OTHER): Payer: Medicare Other | Admitting: Podiatry

## 2016-05-27 DIAGNOSIS — M79676 Pain in unspecified toe(s): Secondary | ICD-10-CM | POA: Diagnosis not present

## 2016-05-27 DIAGNOSIS — B351 Tinea unguium: Secondary | ICD-10-CM | POA: Diagnosis not present

## 2016-05-27 NOTE — Progress Notes (Signed)
Patient ID: Marcus Clark, male   DOB: Sep 04, 1915, 83 y.o.   MRN: [id number]    Subjective: This patient presents for scheduled visit complaining of painful toenails walking wearing shoes and requesting nail debridement. The patient's daughter is present in the treatment room today  Objective: Patient is responsive and appears orientated 3 Bilateral peripheral pitting edema Trace DP and PT pulses bilaterally Capillary reflex immediate bilaterally Dorsi flexion, plantar flexion 5/5 bilaterally No open skin lesions bilaterally The toenails are hypertrophic elongated, incurvated, discolored and tender to direct palpation 6-10  Assessment: Symptomatic onychomycoses 6-10 Diminished pedal pulses Peripheral edema (under treatment per patient's daughter)  Plan: Debrided toenails 10 mechanically and electrically without any bleeding  Reappoint 3 months

## 2016-07-21 IMAGING — CT CT HEAD W/O CM
2 series · 16 of 30 positions shown, 18 images · non-contrast
Comparison: None.

CLINICAL DATA: [AGE] male who fell at 3633 hrs. Lower
extremity edema. Initial encounter.

EXAM:
CT HEAD WITHOUT CONTRAST
TECHNIQUE: Contiguous axial images were obtained from the base of the skull
through the vertex without intravenous contrast.

[Series 201: head w/o, idose (1) · axial · non-contrast · 0.49mm/px · z∈[+131,+246]mm · 8 of 31 slices shown, 10 images]
[im 4/31  brain]
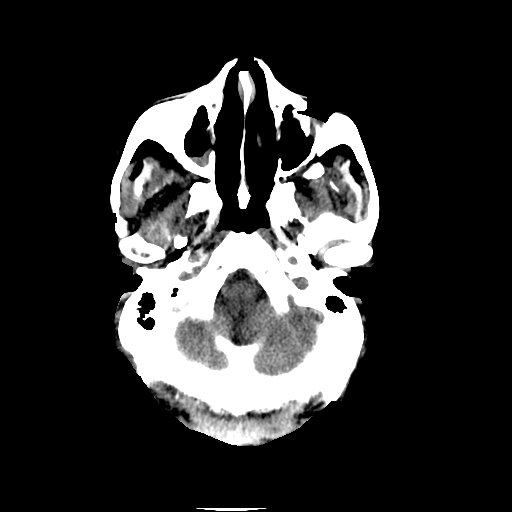
[im 4/31  bone]
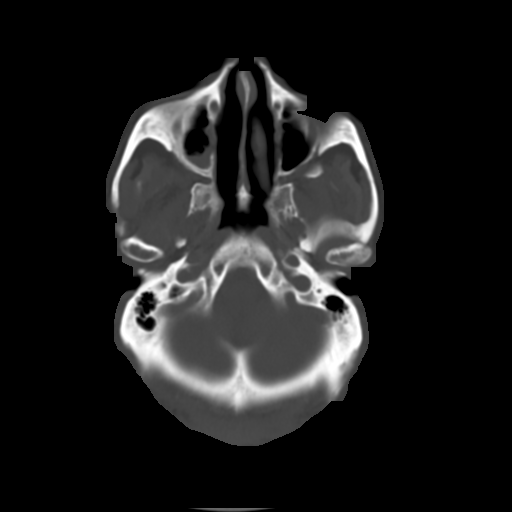
[im 7/31  brain]
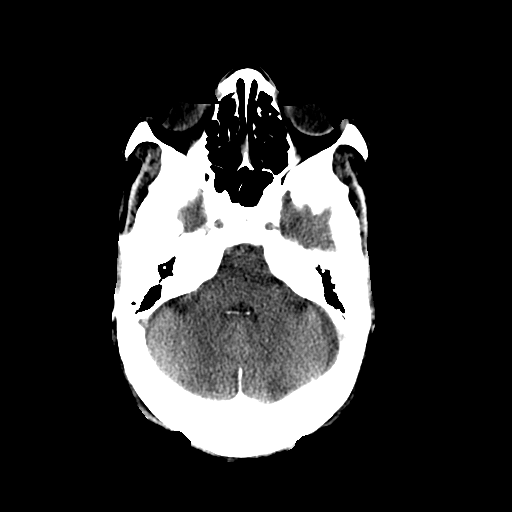
[im 11/31  brain]
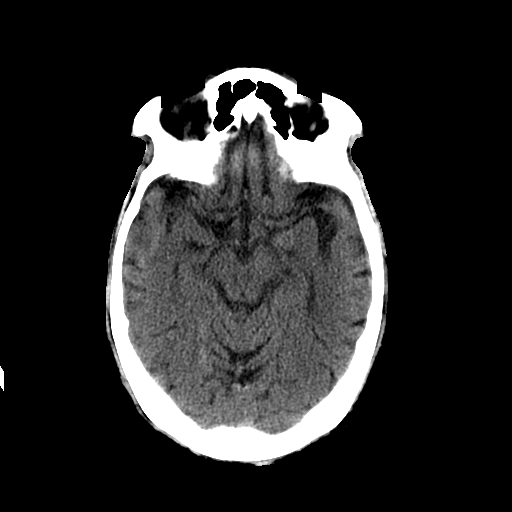
[im 14/31  brain]
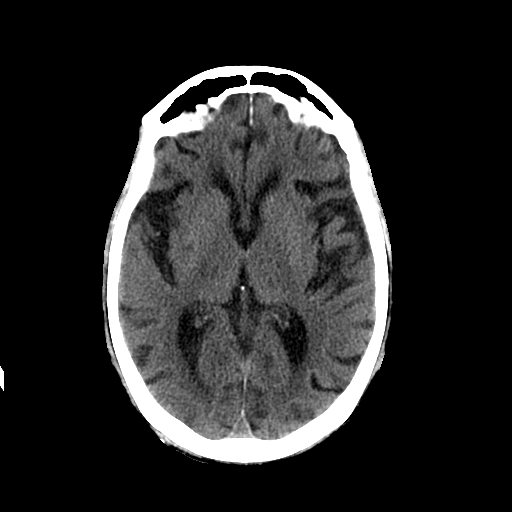
[im 17/31  brain]
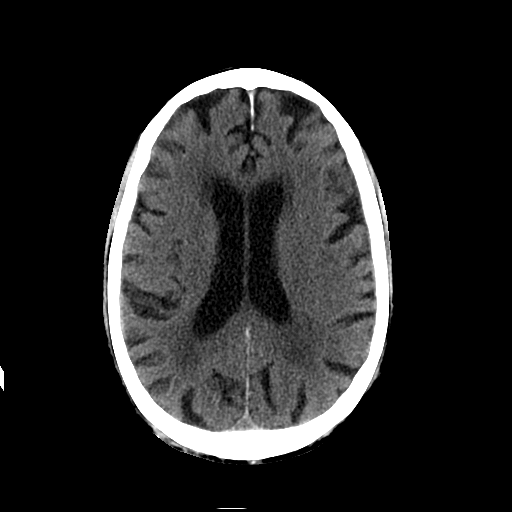
[im 17/31  bone]
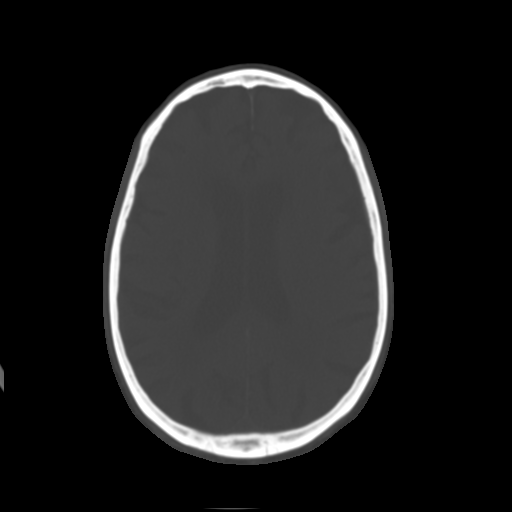
[im 21/31  brain]
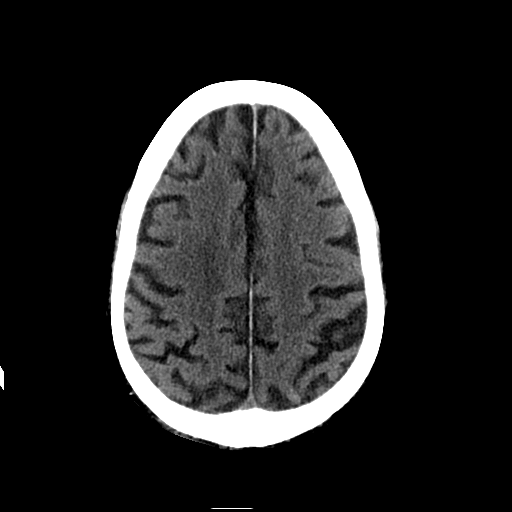
[im 24/31  brain]
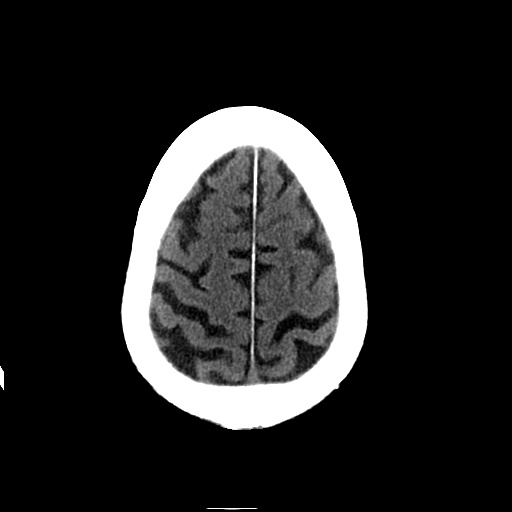
[im 27/31  brain]
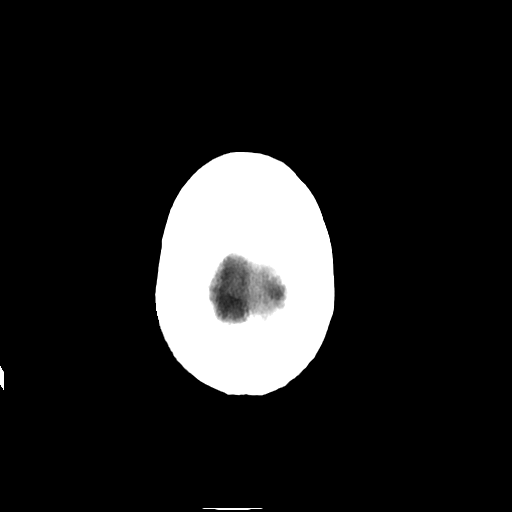

[Series 202: head w/o bone, idose (1) · axial · non-contrast · 0.49mm/px · z∈[+129,+242]mm · 8 of 62 slices shown]
[im 7/62  bone]
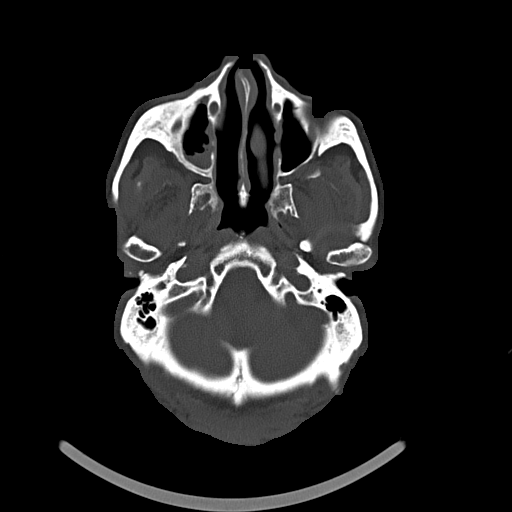
[im 13/62  bone]
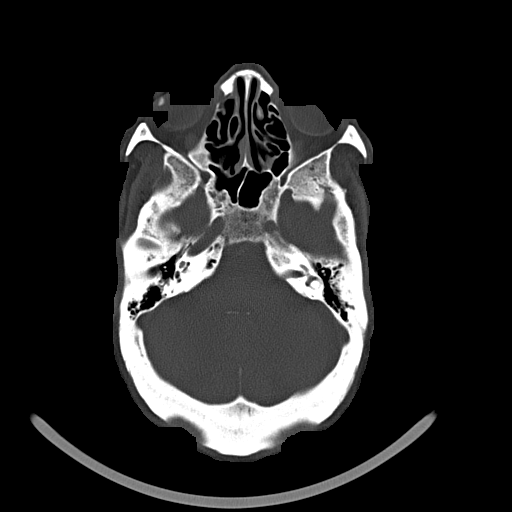
[im 20/62  bone]
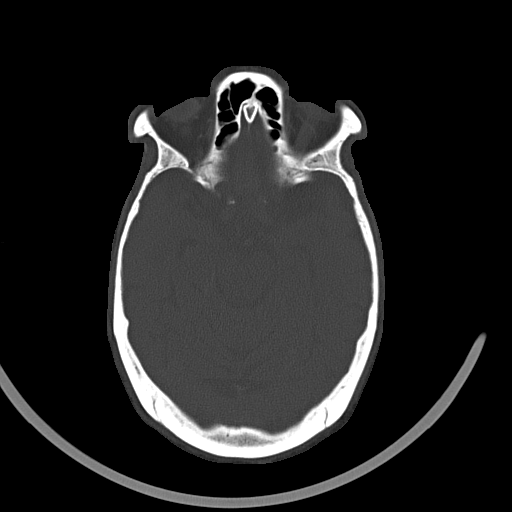
[im 26/62  bone]
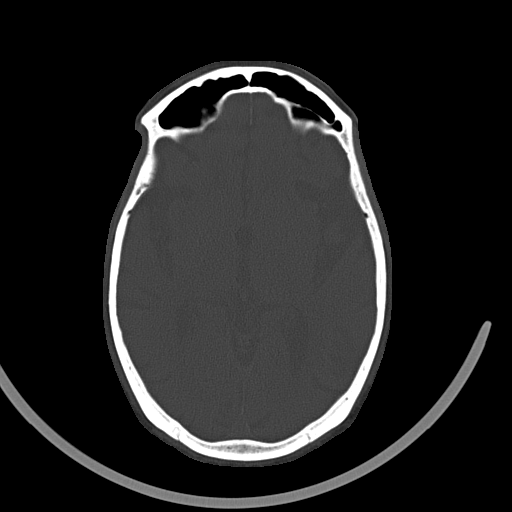
[im 33/62  bone]
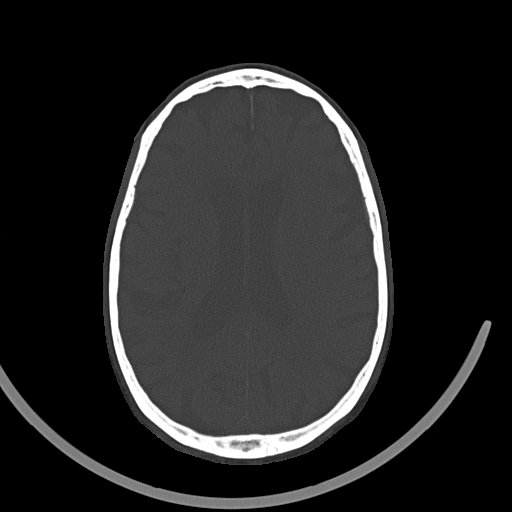
[im 39/62  bone]
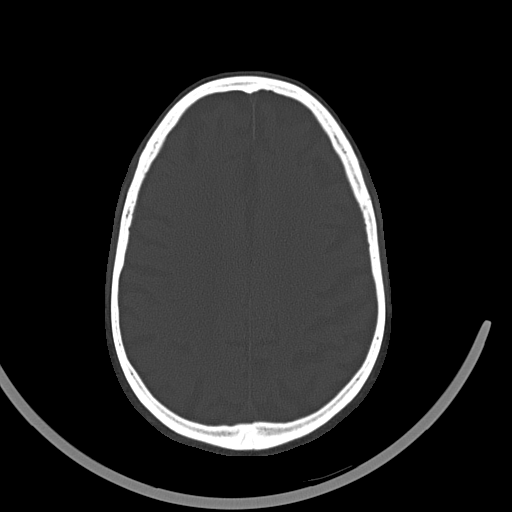
[im 45/62  bone]
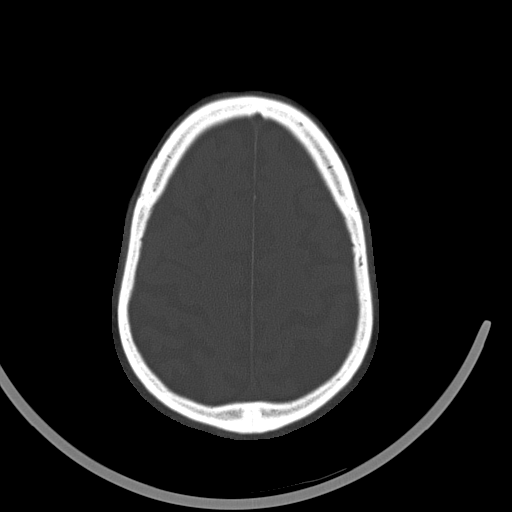
[im 52/62  bone]
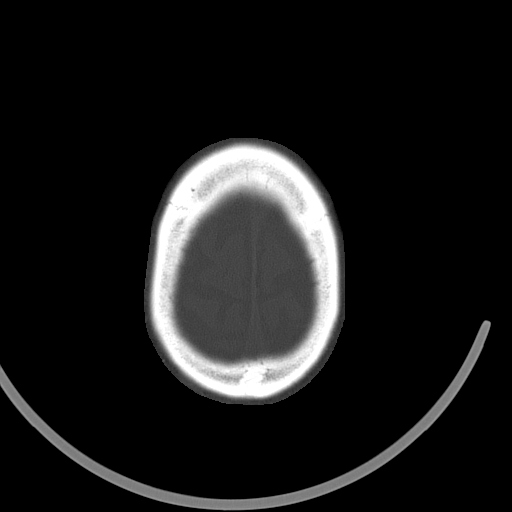

[16 of 30 positions shown; findings below may reference images not displayed]

FINDINGS: Acute on chronic right maxillary sinus disease. Bubbly opacity and
mucoperiosteal thickening. Other Visualized paranasal sinuses and
mastoids are clear. No scalp hematoma. Postoperative changes to the
globes. Calvarium intact.

Cerebral volume is within normal limits for age. No
ventriculomegaly. No midline shift, mass effect, or evidence of
intracranial mass lesion. No acute intracranial hemorrhage
identified.

Patchy cerebral white matter hypodensity, mild to moderate for age.
Chronic lacunar infarct of the right external capsule. No evidence
of cortically based acute infarction identified. No suspicious
intracranial vascular hyperdensity.
IMPRESSION: 1. No acute intracranial abnormality. No acute traumatic injury
identified.
2. Mild to moderate for age small vessel disease.
3. Acute on chronic right maxillary sinusitis.

## 2016-07-21 IMAGING — DX DG CHEST 2V
2 series · 2 of 2 positions shown · non-contrast
Comparison: Radiographs 04/30/2013.  CT 06/24/2011.

CLINICAL DATA: Weakness with shortness of breath and back pain.
Initial encounter.

EXAM:
CHEST  2 VIEW

[chest pa]
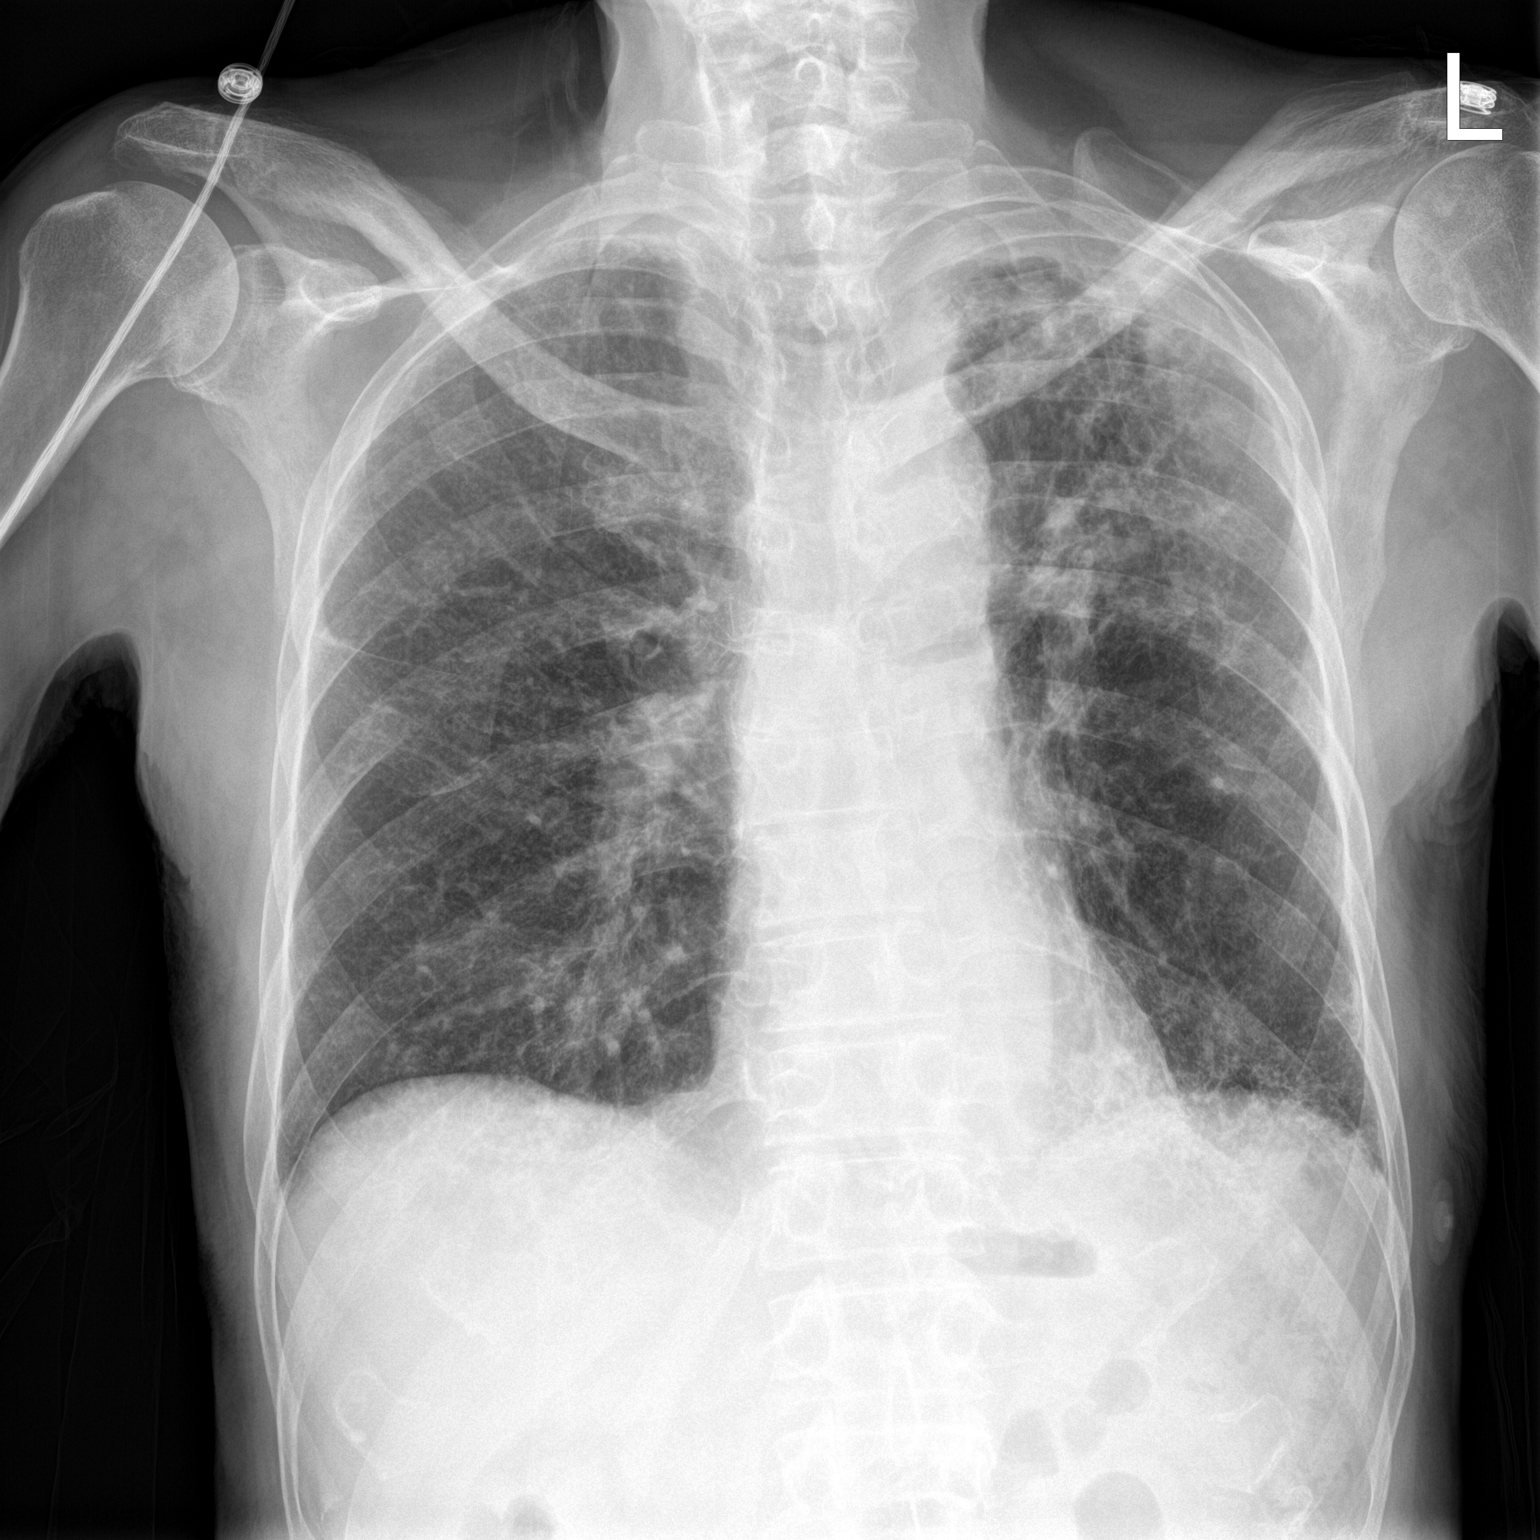

[chest lat]
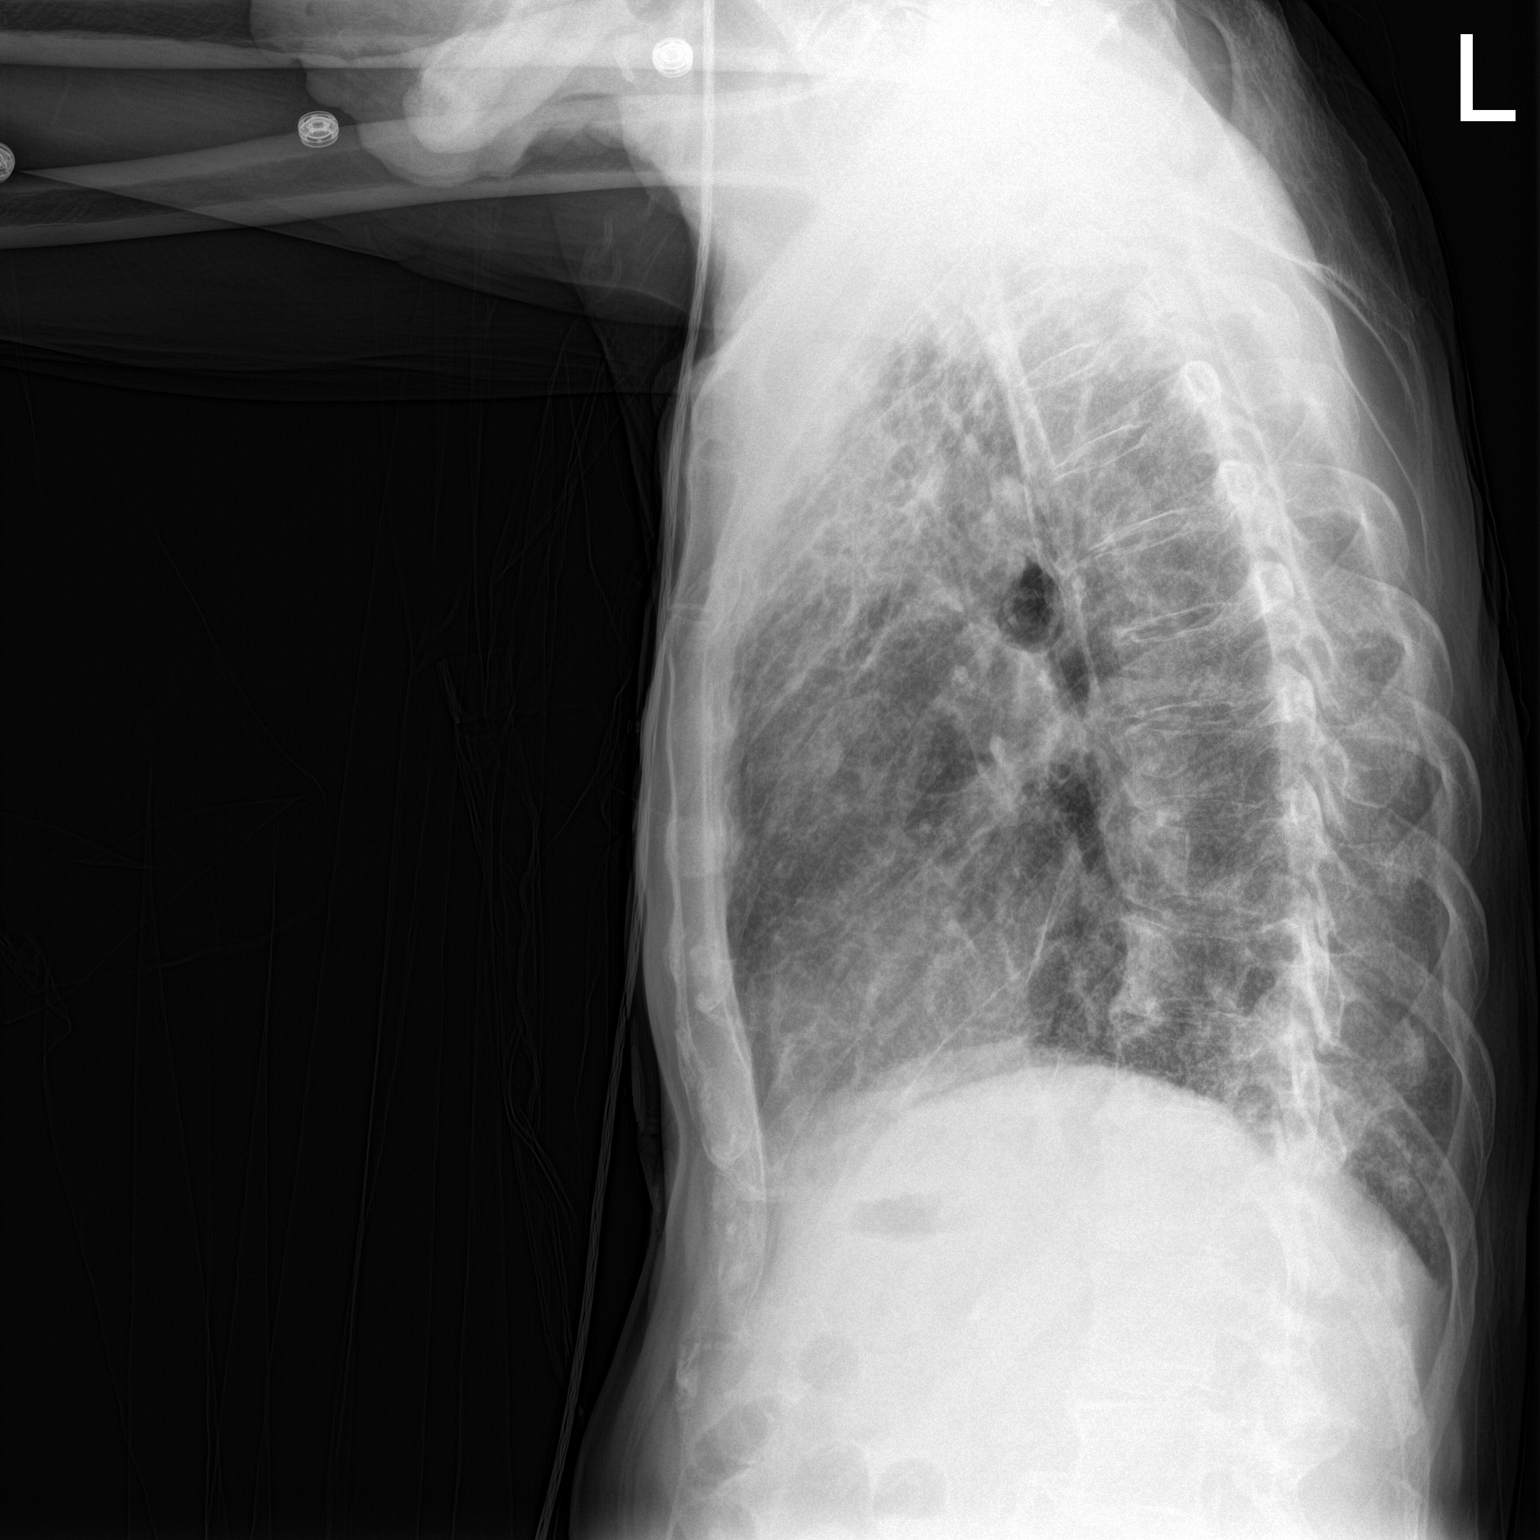

[2 of 2 positions shown; findings below may reference images not displayed]

FINDINGS: The heart size and mediastinal contours are stable. There is stable
asymmetric left apical pleural thickening with adjacent parenchymal
scarring and volume loss. There is mildly increased basilar
atelectasis superimposed on fibrotic change. No confluent airspace
opacity, pleural effusion or pneumothorax is present. The bones
appear unchanged.
IMPRESSION: Mild bibasilar atelectasis superimposed on asymmetric left-sided
pulmonary fibrosis. No acute findings demonstrated.

## 2016-08-26 ENCOUNTER — Ambulatory Visit (INDEPENDENT_AMBULATORY_CARE_PROVIDER_SITE_OTHER): Payer: Medicare Other | Admitting: Podiatry

## 2016-08-26 ENCOUNTER — Encounter: Payer: Self-pay | Admitting: Podiatry

## 2016-08-26 DIAGNOSIS — M79676 Pain in unspecified toe(s): Secondary | ICD-10-CM

## 2016-08-26 DIAGNOSIS — B351 Tinea unguium: Secondary | ICD-10-CM | POA: Diagnosis not present

## 2016-08-26 NOTE — Patient Instructions (Signed)
Purchase heel protector to wear on the left heel at bedtime Okay to apply Vaseline and Band-Aid to the skin on the left heel

## 2016-08-26 NOTE — Progress Notes (Signed)
Patient ID: Marcus Clark, male   DOB: 07/23/1915, 83 y.o.   MRN: [id number]    Subjective: This patient presents for scheduled visit complaining of painful toenails walking wearing shoes and requesting nail debridement. The patient's daughter is present in the treatment room today Patient also complains of some tenderness in the plantar medial left heel  Objective: Patient is responsive and appears orientated 3 Bilateral peripheral pitting edema Trace DP and PT pulses bilaterally Capillary reflex immediate bilaterally Dorsi flexion, plantar flexion 5/5 bilaterally No open skin lesions bilaterally The toenails are hypertrophic elongated, incurvated, discolored and tender to direct palpation 6-10 Plantar medial left heel has mild reactive callus with tenderness to palpation  Assessment: Symptomatic onychomycoses 6-10 Diminished pedal pulses Peripheral edema (under treatment per patient's daughter)  callus left heel  Plan: Debrided toenails 10 mechanically and electrically without any bleeding Recommend heel protector for left heel  suggested application of Vaseline and Band-Aid to left heel   Reappoint 3 months 

## 2016-11-02 DIAGNOSIS — R634 Abnormal weight loss: Secondary | ICD-10-CM | POA: Diagnosis not present

## 2016-11-02 DIAGNOSIS — E785 Hyperlipidemia, unspecified: Secondary | ICD-10-CM | POA: Diagnosis not present

## 2016-11-02 DIAGNOSIS — E640 Sequelae of protein-calorie malnutrition: Secondary | ICD-10-CM | POA: Diagnosis not present

## 2016-11-02 DIAGNOSIS — I739 Peripheral vascular disease, unspecified: Secondary | ICD-10-CM | POA: Diagnosis not present

## 2016-11-02 DIAGNOSIS — D649 Anemia, unspecified: Secondary | ICD-10-CM | POA: Diagnosis not present

## 2016-11-02 DIAGNOSIS — R54 Age-related physical debility: Secondary | ICD-10-CM | POA: Diagnosis not present

## 2016-11-02 DIAGNOSIS — H04123 Dry eye syndrome of bilateral lacrimal glands: Secondary | ICD-10-CM | POA: Diagnosis not present

## 2016-11-02 DIAGNOSIS — R630 Anorexia: Secondary | ICD-10-CM | POA: Diagnosis not present

## 2016-11-02 DIAGNOSIS — I10 Essential (primary) hypertension: Secondary | ICD-10-CM | POA: Diagnosis not present

## 2016-11-02 DIAGNOSIS — E46 Unspecified protein-calorie malnutrition: Secondary | ICD-10-CM | POA: Diagnosis not present

## 2016-11-02 DIAGNOSIS — B372 Candidiasis of skin and nail: Secondary | ICD-10-CM | POA: Diagnosis not present

## 2016-11-03 DIAGNOSIS — I10 Essential (primary) hypertension: Secondary | ICD-10-CM | POA: Diagnosis not present

## 2016-11-03 DIAGNOSIS — R634 Abnormal weight loss: Secondary | ICD-10-CM | POA: Diagnosis not present

## 2016-11-03 DIAGNOSIS — R630 Anorexia: Secondary | ICD-10-CM | POA: Diagnosis not present

## 2016-11-03 DIAGNOSIS — E46 Unspecified protein-calorie malnutrition: Secondary | ICD-10-CM | POA: Diagnosis not present

## 2016-11-03 DIAGNOSIS — R54 Age-related physical debility: Secondary | ICD-10-CM | POA: Diagnosis not present

## 2016-11-03 DIAGNOSIS — E640 Sequelae of protein-calorie malnutrition: Secondary | ICD-10-CM | POA: Diagnosis not present

## 2016-11-06 DIAGNOSIS — E46 Unspecified protein-calorie malnutrition: Secondary | ICD-10-CM | POA: Diagnosis not present

## 2016-11-06 DIAGNOSIS — E640 Sequelae of protein-calorie malnutrition: Secondary | ICD-10-CM | POA: Diagnosis not present

## 2016-11-06 DIAGNOSIS — R634 Abnormal weight loss: Secondary | ICD-10-CM | POA: Diagnosis not present

## 2016-11-06 DIAGNOSIS — I10 Essential (primary) hypertension: Secondary | ICD-10-CM | POA: Diagnosis not present

## 2016-11-06 DIAGNOSIS — R54 Age-related physical debility: Secondary | ICD-10-CM | POA: Diagnosis not present

## 2016-11-06 DIAGNOSIS — R630 Anorexia: Secondary | ICD-10-CM | POA: Diagnosis not present

## 2016-11-11 DIAGNOSIS — R634 Abnormal weight loss: Secondary | ICD-10-CM | POA: Diagnosis not present

## 2016-11-11 DIAGNOSIS — E46 Unspecified protein-calorie malnutrition: Secondary | ICD-10-CM | POA: Diagnosis not present

## 2016-11-11 DIAGNOSIS — E640 Sequelae of protein-calorie malnutrition: Secondary | ICD-10-CM | POA: Diagnosis not present

## 2016-11-11 DIAGNOSIS — R630 Anorexia: Secondary | ICD-10-CM | POA: Diagnosis not present

## 2016-11-11 DIAGNOSIS — I10 Essential (primary) hypertension: Secondary | ICD-10-CM | POA: Diagnosis not present

## 2016-11-11 DIAGNOSIS — R54 Age-related physical debility: Secondary | ICD-10-CM | POA: Diagnosis not present

## 2016-11-12 DIAGNOSIS — R630 Anorexia: Secondary | ICD-10-CM | POA: Diagnosis not present

## 2016-11-12 DIAGNOSIS — I10 Essential (primary) hypertension: Secondary | ICD-10-CM | POA: Diagnosis not present

## 2016-11-12 DIAGNOSIS — R634 Abnormal weight loss: Secondary | ICD-10-CM | POA: Diagnosis not present

## 2016-11-12 DIAGNOSIS — E640 Sequelae of protein-calorie malnutrition: Secondary | ICD-10-CM | POA: Diagnosis not present

## 2016-11-12 DIAGNOSIS — R54 Age-related physical debility: Secondary | ICD-10-CM | POA: Diagnosis not present

## 2016-11-12 DIAGNOSIS — E46 Unspecified protein-calorie malnutrition: Secondary | ICD-10-CM | POA: Diagnosis not present

## 2016-11-17 DIAGNOSIS — R54 Age-related physical debility: Secondary | ICD-10-CM | POA: Diagnosis not present

## 2016-11-17 DIAGNOSIS — I10 Essential (primary) hypertension: Secondary | ICD-10-CM | POA: Diagnosis not present

## 2016-11-17 DIAGNOSIS — R634 Abnormal weight loss: Secondary | ICD-10-CM | POA: Diagnosis not present

## 2016-11-17 DIAGNOSIS — E46 Unspecified protein-calorie malnutrition: Secondary | ICD-10-CM | POA: Diagnosis not present

## 2016-11-17 DIAGNOSIS — E640 Sequelae of protein-calorie malnutrition: Secondary | ICD-10-CM | POA: Diagnosis not present

## 2016-11-17 DIAGNOSIS — R630 Anorexia: Secondary | ICD-10-CM | POA: Diagnosis not present

## 2016-11-19 DIAGNOSIS — R634 Abnormal weight loss: Secondary | ICD-10-CM | POA: Diagnosis not present

## 2016-11-19 DIAGNOSIS — I10 Essential (primary) hypertension: Secondary | ICD-10-CM | POA: Diagnosis not present

## 2016-11-19 DIAGNOSIS — E46 Unspecified protein-calorie malnutrition: Secondary | ICD-10-CM | POA: Diagnosis not present

## 2016-11-19 DIAGNOSIS — E640 Sequelae of protein-calorie malnutrition: Secondary | ICD-10-CM | POA: Diagnosis not present

## 2016-11-19 DIAGNOSIS — R54 Age-related physical debility: Secondary | ICD-10-CM | POA: Diagnosis not present

## 2016-11-19 DIAGNOSIS — R630 Anorexia: Secondary | ICD-10-CM | POA: Diagnosis not present

## 2016-11-20 DIAGNOSIS — I10 Essential (primary) hypertension: Secondary | ICD-10-CM | POA: Diagnosis not present

## 2016-11-20 DIAGNOSIS — R630 Anorexia: Secondary | ICD-10-CM | POA: Diagnosis not present

## 2016-11-20 DIAGNOSIS — R54 Age-related physical debility: Secondary | ICD-10-CM | POA: Diagnosis not present

## 2016-11-20 DIAGNOSIS — E640 Sequelae of protein-calorie malnutrition: Secondary | ICD-10-CM | POA: Diagnosis not present

## 2016-11-20 DIAGNOSIS — E46 Unspecified protein-calorie malnutrition: Secondary | ICD-10-CM | POA: Diagnosis not present

## 2016-11-20 DIAGNOSIS — R634 Abnormal weight loss: Secondary | ICD-10-CM | POA: Diagnosis not present

## 2016-11-24 DIAGNOSIS — R634 Abnormal weight loss: Secondary | ICD-10-CM | POA: Diagnosis not present

## 2016-11-24 DIAGNOSIS — I10 Essential (primary) hypertension: Secondary | ICD-10-CM | POA: Diagnosis not present

## 2016-11-24 DIAGNOSIS — R630 Anorexia: Secondary | ICD-10-CM | POA: Diagnosis not present

## 2016-11-24 DIAGNOSIS — E640 Sequelae of protein-calorie malnutrition: Secondary | ICD-10-CM | POA: Diagnosis not present

## 2016-11-24 DIAGNOSIS — E46 Unspecified protein-calorie malnutrition: Secondary | ICD-10-CM | POA: Diagnosis not present

## 2016-11-24 DIAGNOSIS — R54 Age-related physical debility: Secondary | ICD-10-CM | POA: Diagnosis not present

## 2016-11-26 ENCOUNTER — Ambulatory Visit: Payer: Medicare Other | Admitting: Podiatry

## 2016-11-26 DIAGNOSIS — E640 Sequelae of protein-calorie malnutrition: Secondary | ICD-10-CM | POA: Diagnosis not present

## 2016-11-26 DIAGNOSIS — R54 Age-related physical debility: Secondary | ICD-10-CM | POA: Diagnosis not present

## 2016-11-26 DIAGNOSIS — E46 Unspecified protein-calorie malnutrition: Secondary | ICD-10-CM | POA: Diagnosis not present

## 2016-11-26 DIAGNOSIS — R634 Abnormal weight loss: Secondary | ICD-10-CM | POA: Diagnosis not present

## 2016-11-26 DIAGNOSIS — R630 Anorexia: Secondary | ICD-10-CM | POA: Diagnosis not present

## 2016-11-26 DIAGNOSIS — I10 Essential (primary) hypertension: Secondary | ICD-10-CM | POA: Diagnosis not present

## 2016-11-27 DIAGNOSIS — R54 Age-related physical debility: Secondary | ICD-10-CM | POA: Diagnosis not present

## 2016-11-27 DIAGNOSIS — R634 Abnormal weight loss: Secondary | ICD-10-CM | POA: Diagnosis not present

## 2016-11-27 DIAGNOSIS — I10 Essential (primary) hypertension: Secondary | ICD-10-CM | POA: Diagnosis not present

## 2016-11-27 DIAGNOSIS — E640 Sequelae of protein-calorie malnutrition: Secondary | ICD-10-CM | POA: Diagnosis not present

## 2016-11-27 DIAGNOSIS — R630 Anorexia: Secondary | ICD-10-CM | POA: Diagnosis not present

## 2016-11-27 DIAGNOSIS — E46 Unspecified protein-calorie malnutrition: Secondary | ICD-10-CM | POA: Diagnosis not present

## 2016-12-01 DIAGNOSIS — E640 Sequelae of protein-calorie malnutrition: Secondary | ICD-10-CM | POA: Diagnosis not present

## 2016-12-01 DIAGNOSIS — E46 Unspecified protein-calorie malnutrition: Secondary | ICD-10-CM | POA: Diagnosis not present

## 2016-12-01 DIAGNOSIS — R54 Age-related physical debility: Secondary | ICD-10-CM | POA: Diagnosis not present

## 2016-12-01 DIAGNOSIS — R634 Abnormal weight loss: Secondary | ICD-10-CM | POA: Diagnosis not present

## 2016-12-01 DIAGNOSIS — I10 Essential (primary) hypertension: Secondary | ICD-10-CM | POA: Diagnosis not present

## 2016-12-01 DIAGNOSIS — R630 Anorexia: Secondary | ICD-10-CM | POA: Diagnosis not present

## 2016-12-03 DIAGNOSIS — H04123 Dry eye syndrome of bilateral lacrimal glands: Secondary | ICD-10-CM | POA: Diagnosis not present

## 2016-12-03 DIAGNOSIS — E46 Unspecified protein-calorie malnutrition: Secondary | ICD-10-CM | POA: Diagnosis not present

## 2016-12-03 DIAGNOSIS — E785 Hyperlipidemia, unspecified: Secondary | ICD-10-CM | POA: Diagnosis not present

## 2016-12-03 DIAGNOSIS — R634 Abnormal weight loss: Secondary | ICD-10-CM | POA: Diagnosis not present

## 2016-12-03 DIAGNOSIS — I739 Peripheral vascular disease, unspecified: Secondary | ICD-10-CM | POA: Diagnosis not present

## 2016-12-03 DIAGNOSIS — R54 Age-related physical debility: Secondary | ICD-10-CM | POA: Diagnosis not present

## 2016-12-03 DIAGNOSIS — E640 Sequelae of protein-calorie malnutrition: Secondary | ICD-10-CM | POA: Diagnosis not present

## 2016-12-03 DIAGNOSIS — D649 Anemia, unspecified: Secondary | ICD-10-CM | POA: Diagnosis not present

## 2016-12-03 DIAGNOSIS — B372 Candidiasis of skin and nail: Secondary | ICD-10-CM | POA: Diagnosis not present

## 2016-12-03 DIAGNOSIS — R630 Anorexia: Secondary | ICD-10-CM | POA: Diagnosis not present

## 2016-12-03 DIAGNOSIS — I10 Essential (primary) hypertension: Secondary | ICD-10-CM | POA: Diagnosis not present

## 2016-12-04 DIAGNOSIS — E640 Sequelae of protein-calorie malnutrition: Secondary | ICD-10-CM | POA: Diagnosis not present

## 2016-12-04 DIAGNOSIS — R634 Abnormal weight loss: Secondary | ICD-10-CM | POA: Diagnosis not present

## 2016-12-04 DIAGNOSIS — R630 Anorexia: Secondary | ICD-10-CM | POA: Diagnosis not present

## 2016-12-04 DIAGNOSIS — E46 Unspecified protein-calorie malnutrition: Secondary | ICD-10-CM | POA: Diagnosis not present

## 2016-12-04 DIAGNOSIS — I10 Essential (primary) hypertension: Secondary | ICD-10-CM | POA: Diagnosis not present

## 2016-12-04 DIAGNOSIS — R54 Age-related physical debility: Secondary | ICD-10-CM | POA: Diagnosis not present

## 2016-12-05 DIAGNOSIS — I10 Essential (primary) hypertension: Secondary | ICD-10-CM | POA: Diagnosis not present

## 2016-12-05 DIAGNOSIS — E46 Unspecified protein-calorie malnutrition: Secondary | ICD-10-CM | POA: Diagnosis not present

## 2016-12-05 DIAGNOSIS — E640 Sequelae of protein-calorie malnutrition: Secondary | ICD-10-CM | POA: Diagnosis not present

## 2016-12-05 DIAGNOSIS — R634 Abnormal weight loss: Secondary | ICD-10-CM | POA: Diagnosis not present

## 2016-12-05 DIAGNOSIS — R630 Anorexia: Secondary | ICD-10-CM | POA: Diagnosis not present

## 2016-12-05 DIAGNOSIS — R54 Age-related physical debility: Secondary | ICD-10-CM | POA: Diagnosis not present

## 2016-12-08 DIAGNOSIS — I10 Essential (primary) hypertension: Secondary | ICD-10-CM | POA: Diagnosis not present

## 2016-12-08 DIAGNOSIS — R630 Anorexia: Secondary | ICD-10-CM | POA: Diagnosis not present

## 2016-12-08 DIAGNOSIS — R54 Age-related physical debility: Secondary | ICD-10-CM | POA: Diagnosis not present

## 2016-12-08 DIAGNOSIS — R634 Abnormal weight loss: Secondary | ICD-10-CM | POA: Diagnosis not present

## 2016-12-08 DIAGNOSIS — E640 Sequelae of protein-calorie malnutrition: Secondary | ICD-10-CM | POA: Diagnosis not present

## 2016-12-08 DIAGNOSIS — E46 Unspecified protein-calorie malnutrition: Secondary | ICD-10-CM | POA: Diagnosis not present

## 2016-12-11 DIAGNOSIS — R54 Age-related physical debility: Secondary | ICD-10-CM | POA: Diagnosis not present

## 2016-12-11 DIAGNOSIS — R630 Anorexia: Secondary | ICD-10-CM | POA: Diagnosis not present

## 2016-12-11 DIAGNOSIS — E46 Unspecified protein-calorie malnutrition: Secondary | ICD-10-CM | POA: Diagnosis not present

## 2016-12-11 DIAGNOSIS — E640 Sequelae of protein-calorie malnutrition: Secondary | ICD-10-CM | POA: Diagnosis not present

## 2016-12-11 DIAGNOSIS — R634 Abnormal weight loss: Secondary | ICD-10-CM | POA: Diagnosis not present

## 2016-12-11 DIAGNOSIS — I10 Essential (primary) hypertension: Secondary | ICD-10-CM | POA: Diagnosis not present

## 2016-12-12 DIAGNOSIS — R630 Anorexia: Secondary | ICD-10-CM | POA: Diagnosis not present

## 2016-12-12 DIAGNOSIS — E46 Unspecified protein-calorie malnutrition: Secondary | ICD-10-CM | POA: Diagnosis not present

## 2016-12-12 DIAGNOSIS — E640 Sequelae of protein-calorie malnutrition: Secondary | ICD-10-CM | POA: Diagnosis not present

## 2016-12-12 DIAGNOSIS — I10 Essential (primary) hypertension: Secondary | ICD-10-CM | POA: Diagnosis not present

## 2016-12-12 DIAGNOSIS — R634 Abnormal weight loss: Secondary | ICD-10-CM | POA: Diagnosis not present

## 2016-12-12 DIAGNOSIS — R54 Age-related physical debility: Secondary | ICD-10-CM | POA: Diagnosis not present

## 2016-12-15 DIAGNOSIS — E640 Sequelae of protein-calorie malnutrition: Secondary | ICD-10-CM | POA: Diagnosis not present

## 2016-12-15 DIAGNOSIS — R630 Anorexia: Secondary | ICD-10-CM | POA: Diagnosis not present

## 2016-12-15 DIAGNOSIS — R634 Abnormal weight loss: Secondary | ICD-10-CM | POA: Diagnosis not present

## 2016-12-15 DIAGNOSIS — E46 Unspecified protein-calorie malnutrition: Secondary | ICD-10-CM | POA: Diagnosis not present

## 2016-12-15 DIAGNOSIS — R54 Age-related physical debility: Secondary | ICD-10-CM | POA: Diagnosis not present

## 2016-12-15 DIAGNOSIS — I10 Essential (primary) hypertension: Secondary | ICD-10-CM | POA: Diagnosis not present

## 2016-12-17 ENCOUNTER — Encounter: Payer: Self-pay | Admitting: Podiatry

## 2016-12-17 ENCOUNTER — Ambulatory Visit (INDEPENDENT_AMBULATORY_CARE_PROVIDER_SITE_OTHER): Payer: Medicare Other | Admitting: Podiatry

## 2016-12-17 VITALS — BP 100/56 | HR 107 | Resp 18

## 2016-12-17 DIAGNOSIS — M79676 Pain in unspecified toe(s): Secondary | ICD-10-CM | POA: Diagnosis not present

## 2016-12-17 DIAGNOSIS — B351 Tinea unguium: Secondary | ICD-10-CM

## 2016-12-17 DIAGNOSIS — I739 Peripheral vascular disease, unspecified: Secondary | ICD-10-CM

## 2016-12-17 NOTE — Progress Notes (Signed)
   Subjective:    Patient ID: Marcus Clark, male    DOB: 01-10-1916, 83 y.o.   MRN: [id number]  HPI    Review of Systems  Cardiovascular:       Blood pressure does        Objective:   Physical Exam        Assessment & Plan:

## 2016-12-17 NOTE — Progress Notes (Signed)
Patient ID: Marcus SilkJames W Lawley, male   DOB: 1915-10-05, 83 y.o.   MRN: [id number]    Subjective: This patient presents for scheduled visit complaining of painful toenails walking wearing shoes and requesting nail debridement. The patient's daughter is present in the treatment room today Patient also complains of some tenderness in the plantar medial left heel  Objective: Patient is responsive and appears orientated 3 Bilateral peripheral pitting edema Trace DP and PT pulses bilaterally Capillary reflex immediate bilaterally Dorsi flexion, plantar flexion 5/5 bilaterally No open skin lesions bilaterally The toenails are hypertrophic elongated, incurvated, discolored and tender to direct palpation 6-10 Plantar medial left heel has mild reactive callus with tenderness to palpation  Assessment: Symptomatic onychomycoses 6-10 Diminished pedal pulses Peripheral edema (under treatment per patient's daughter)  callus left heel  Plan: Debrided toenails 10 mechanically and electrically without any bleeding Recommend heel protector for left heel  suggested application of Vaseline and Band-Aid to left heel   Reappoint 3 months

## 2016-12-18 DIAGNOSIS — E46 Unspecified protein-calorie malnutrition: Secondary | ICD-10-CM | POA: Diagnosis not present

## 2016-12-18 DIAGNOSIS — I10 Essential (primary) hypertension: Secondary | ICD-10-CM | POA: Diagnosis not present

## 2016-12-18 DIAGNOSIS — R54 Age-related physical debility: Secondary | ICD-10-CM | POA: Diagnosis not present

## 2016-12-18 DIAGNOSIS — E640 Sequelae of protein-calorie malnutrition: Secondary | ICD-10-CM | POA: Diagnosis not present

## 2016-12-18 DIAGNOSIS — R630 Anorexia: Secondary | ICD-10-CM | POA: Diagnosis not present

## 2016-12-18 DIAGNOSIS — R634 Abnormal weight loss: Secondary | ICD-10-CM | POA: Diagnosis not present

## 2016-12-19 DIAGNOSIS — E640 Sequelae of protein-calorie malnutrition: Secondary | ICD-10-CM | POA: Diagnosis not present

## 2016-12-19 DIAGNOSIS — R630 Anorexia: Secondary | ICD-10-CM | POA: Diagnosis not present

## 2016-12-19 DIAGNOSIS — E46 Unspecified protein-calorie malnutrition: Secondary | ICD-10-CM | POA: Diagnosis not present

## 2016-12-19 DIAGNOSIS — I10 Essential (primary) hypertension: Secondary | ICD-10-CM | POA: Diagnosis not present

## 2016-12-19 DIAGNOSIS — R634 Abnormal weight loss: Secondary | ICD-10-CM | POA: Diagnosis not present

## 2016-12-19 DIAGNOSIS — R54 Age-related physical debility: Secondary | ICD-10-CM | POA: Diagnosis not present

## 2016-12-22 DIAGNOSIS — E640 Sequelae of protein-calorie malnutrition: Secondary | ICD-10-CM | POA: Diagnosis not present

## 2016-12-22 DIAGNOSIS — E46 Unspecified protein-calorie malnutrition: Secondary | ICD-10-CM | POA: Diagnosis not present

## 2016-12-22 DIAGNOSIS — R630 Anorexia: Secondary | ICD-10-CM | POA: Diagnosis not present

## 2016-12-22 DIAGNOSIS — I10 Essential (primary) hypertension: Secondary | ICD-10-CM | POA: Diagnosis not present

## 2016-12-22 DIAGNOSIS — R634 Abnormal weight loss: Secondary | ICD-10-CM | POA: Diagnosis not present

## 2016-12-22 DIAGNOSIS — R54 Age-related physical debility: Secondary | ICD-10-CM | POA: Diagnosis not present

## 2016-12-23 DIAGNOSIS — R630 Anorexia: Secondary | ICD-10-CM | POA: Diagnosis not present

## 2016-12-23 DIAGNOSIS — E640 Sequelae of protein-calorie malnutrition: Secondary | ICD-10-CM | POA: Diagnosis not present

## 2016-12-23 DIAGNOSIS — R54 Age-related physical debility: Secondary | ICD-10-CM | POA: Diagnosis not present

## 2016-12-23 DIAGNOSIS — I10 Essential (primary) hypertension: Secondary | ICD-10-CM | POA: Diagnosis not present

## 2016-12-23 DIAGNOSIS — R634 Abnormal weight loss: Secondary | ICD-10-CM | POA: Diagnosis not present

## 2016-12-23 DIAGNOSIS — E46 Unspecified protein-calorie malnutrition: Secondary | ICD-10-CM | POA: Diagnosis not present

## 2016-12-25 DIAGNOSIS — E46 Unspecified protein-calorie malnutrition: Secondary | ICD-10-CM | POA: Diagnosis not present

## 2016-12-25 DIAGNOSIS — E640 Sequelae of protein-calorie malnutrition: Secondary | ICD-10-CM | POA: Diagnosis not present

## 2016-12-25 DIAGNOSIS — R54 Age-related physical debility: Secondary | ICD-10-CM | POA: Diagnosis not present

## 2016-12-25 DIAGNOSIS — R634 Abnormal weight loss: Secondary | ICD-10-CM | POA: Diagnosis not present

## 2016-12-25 DIAGNOSIS — I10 Essential (primary) hypertension: Secondary | ICD-10-CM | POA: Diagnosis not present

## 2016-12-25 DIAGNOSIS — R630 Anorexia: Secondary | ICD-10-CM | POA: Diagnosis not present

## 2016-12-26 DIAGNOSIS — R634 Abnormal weight loss: Secondary | ICD-10-CM | POA: Diagnosis not present

## 2016-12-26 DIAGNOSIS — E46 Unspecified protein-calorie malnutrition: Secondary | ICD-10-CM | POA: Diagnosis not present

## 2016-12-26 DIAGNOSIS — R54 Age-related physical debility: Secondary | ICD-10-CM | POA: Diagnosis not present

## 2016-12-26 DIAGNOSIS — R630 Anorexia: Secondary | ICD-10-CM | POA: Diagnosis not present

## 2016-12-26 DIAGNOSIS — E640 Sequelae of protein-calorie malnutrition: Secondary | ICD-10-CM | POA: Diagnosis not present

## 2016-12-26 DIAGNOSIS — I10 Essential (primary) hypertension: Secondary | ICD-10-CM | POA: Diagnosis not present

## 2016-12-31 DIAGNOSIS — I10 Essential (primary) hypertension: Secondary | ICD-10-CM | POA: Diagnosis not present

## 2016-12-31 DIAGNOSIS — E640 Sequelae of protein-calorie malnutrition: Secondary | ICD-10-CM | POA: Diagnosis not present

## 2016-12-31 DIAGNOSIS — E46 Unspecified protein-calorie malnutrition: Secondary | ICD-10-CM | POA: Diagnosis not present

## 2016-12-31 DIAGNOSIS — R54 Age-related physical debility: Secondary | ICD-10-CM | POA: Diagnosis not present

## 2016-12-31 DIAGNOSIS — R630 Anorexia: Secondary | ICD-10-CM | POA: Diagnosis not present

## 2016-12-31 DIAGNOSIS — R634 Abnormal weight loss: Secondary | ICD-10-CM | POA: Diagnosis not present

## 2017-01-01 DIAGNOSIS — R634 Abnormal weight loss: Secondary | ICD-10-CM | POA: Diagnosis not present

## 2017-01-01 DIAGNOSIS — E46 Unspecified protein-calorie malnutrition: Secondary | ICD-10-CM | POA: Diagnosis not present

## 2017-01-01 DIAGNOSIS — R54 Age-related physical debility: Secondary | ICD-10-CM | POA: Diagnosis not present

## 2017-01-01 DIAGNOSIS — R630 Anorexia: Secondary | ICD-10-CM | POA: Diagnosis not present

## 2017-01-01 DIAGNOSIS — E640 Sequelae of protein-calorie malnutrition: Secondary | ICD-10-CM | POA: Diagnosis not present

## 2017-01-01 DIAGNOSIS — I10 Essential (primary) hypertension: Secondary | ICD-10-CM | POA: Diagnosis not present

## 2017-01-03 DIAGNOSIS — R54 Age-related physical debility: Secondary | ICD-10-CM | POA: Diagnosis not present

## 2017-01-03 DIAGNOSIS — I10 Essential (primary) hypertension: Secondary | ICD-10-CM | POA: Diagnosis not present

## 2017-01-03 DIAGNOSIS — I739 Peripheral vascular disease, unspecified: Secondary | ICD-10-CM | POA: Diagnosis not present

## 2017-01-03 DIAGNOSIS — B372 Candidiasis of skin and nail: Secondary | ICD-10-CM | POA: Diagnosis not present

## 2017-01-03 DIAGNOSIS — R634 Abnormal weight loss: Secondary | ICD-10-CM | POA: Diagnosis not present

## 2017-01-03 DIAGNOSIS — E785 Hyperlipidemia, unspecified: Secondary | ICD-10-CM | POA: Diagnosis not present

## 2017-01-03 DIAGNOSIS — D649 Anemia, unspecified: Secondary | ICD-10-CM | POA: Diagnosis not present

## 2017-01-03 DIAGNOSIS — H04123 Dry eye syndrome of bilateral lacrimal glands: Secondary | ICD-10-CM | POA: Diagnosis not present

## 2017-01-03 DIAGNOSIS — R630 Anorexia: Secondary | ICD-10-CM | POA: Diagnosis not present

## 2017-01-03 DIAGNOSIS — E640 Sequelae of protein-calorie malnutrition: Secondary | ICD-10-CM | POA: Diagnosis not present

## 2017-01-03 DIAGNOSIS — E46 Unspecified protein-calorie malnutrition: Secondary | ICD-10-CM | POA: Diagnosis not present

## 2017-01-08 DIAGNOSIS — E640 Sequelae of protein-calorie malnutrition: Secondary | ICD-10-CM | POA: Diagnosis not present

## 2017-01-08 DIAGNOSIS — E46 Unspecified protein-calorie malnutrition: Secondary | ICD-10-CM | POA: Diagnosis not present

## 2017-01-08 DIAGNOSIS — I10 Essential (primary) hypertension: Secondary | ICD-10-CM | POA: Diagnosis not present

## 2017-01-08 DIAGNOSIS — R54 Age-related physical debility: Secondary | ICD-10-CM | POA: Diagnosis not present

## 2017-01-08 DIAGNOSIS — R634 Abnormal weight loss: Secondary | ICD-10-CM | POA: Diagnosis not present

## 2017-01-08 DIAGNOSIS — R630 Anorexia: Secondary | ICD-10-CM | POA: Diagnosis not present

## 2017-01-09 DIAGNOSIS — R634 Abnormal weight loss: Secondary | ICD-10-CM | POA: Diagnosis not present

## 2017-01-09 DIAGNOSIS — E640 Sequelae of protein-calorie malnutrition: Secondary | ICD-10-CM | POA: Diagnosis not present

## 2017-01-09 DIAGNOSIS — R630 Anorexia: Secondary | ICD-10-CM | POA: Diagnosis not present

## 2017-01-09 DIAGNOSIS — I10 Essential (primary) hypertension: Secondary | ICD-10-CM | POA: Diagnosis not present

## 2017-01-09 DIAGNOSIS — R54 Age-related physical debility: Secondary | ICD-10-CM | POA: Diagnosis not present

## 2017-01-09 DIAGNOSIS — E46 Unspecified protein-calorie malnutrition: Secondary | ICD-10-CM | POA: Diagnosis not present

## 2017-01-12 DIAGNOSIS — R630 Anorexia: Secondary | ICD-10-CM | POA: Diagnosis not present

## 2017-01-12 DIAGNOSIS — I10 Essential (primary) hypertension: Secondary | ICD-10-CM | POA: Diagnosis not present

## 2017-01-12 DIAGNOSIS — E640 Sequelae of protein-calorie malnutrition: Secondary | ICD-10-CM | POA: Diagnosis not present

## 2017-01-12 DIAGNOSIS — E46 Unspecified protein-calorie malnutrition: Secondary | ICD-10-CM | POA: Diagnosis not present

## 2017-01-12 DIAGNOSIS — R54 Age-related physical debility: Secondary | ICD-10-CM | POA: Diagnosis not present

## 2017-01-12 DIAGNOSIS — R634 Abnormal weight loss: Secondary | ICD-10-CM | POA: Diagnosis not present

## 2017-01-13 DIAGNOSIS — R630 Anorexia: Secondary | ICD-10-CM | POA: Diagnosis not present

## 2017-01-13 DIAGNOSIS — E46 Unspecified protein-calorie malnutrition: Secondary | ICD-10-CM | POA: Diagnosis not present

## 2017-01-13 DIAGNOSIS — I10 Essential (primary) hypertension: Secondary | ICD-10-CM | POA: Diagnosis not present

## 2017-01-13 DIAGNOSIS — R54 Age-related physical debility: Secondary | ICD-10-CM | POA: Diagnosis not present

## 2017-01-13 DIAGNOSIS — E640 Sequelae of protein-calorie malnutrition: Secondary | ICD-10-CM | POA: Diagnosis not present

## 2017-01-13 DIAGNOSIS — R634 Abnormal weight loss: Secondary | ICD-10-CM | POA: Diagnosis not present

## 2017-01-15 DIAGNOSIS — R630 Anorexia: Secondary | ICD-10-CM | POA: Diagnosis not present

## 2017-01-15 DIAGNOSIS — E640 Sequelae of protein-calorie malnutrition: Secondary | ICD-10-CM | POA: Diagnosis not present

## 2017-01-15 DIAGNOSIS — R634 Abnormal weight loss: Secondary | ICD-10-CM | POA: Diagnosis not present

## 2017-01-15 DIAGNOSIS — I10 Essential (primary) hypertension: Secondary | ICD-10-CM | POA: Diagnosis not present

## 2017-01-15 DIAGNOSIS — R54 Age-related physical debility: Secondary | ICD-10-CM | POA: Diagnosis not present

## 2017-01-15 DIAGNOSIS — E46 Unspecified protein-calorie malnutrition: Secondary | ICD-10-CM | POA: Diagnosis not present

## 2017-01-16 DIAGNOSIS — E640 Sequelae of protein-calorie malnutrition: Secondary | ICD-10-CM | POA: Diagnosis not present

## 2017-01-16 DIAGNOSIS — R54 Age-related physical debility: Secondary | ICD-10-CM | POA: Diagnosis not present

## 2017-01-16 DIAGNOSIS — R634 Abnormal weight loss: Secondary | ICD-10-CM | POA: Diagnosis not present

## 2017-01-16 DIAGNOSIS — I10 Essential (primary) hypertension: Secondary | ICD-10-CM | POA: Diagnosis not present

## 2017-01-16 DIAGNOSIS — R630 Anorexia: Secondary | ICD-10-CM | POA: Diagnosis not present

## 2017-01-16 DIAGNOSIS — E46 Unspecified protein-calorie malnutrition: Secondary | ICD-10-CM | POA: Diagnosis not present

## 2017-01-19 DIAGNOSIS — R54 Age-related physical debility: Secondary | ICD-10-CM | POA: Diagnosis not present

## 2017-01-19 DIAGNOSIS — R630 Anorexia: Secondary | ICD-10-CM | POA: Diagnosis not present

## 2017-01-19 DIAGNOSIS — R634 Abnormal weight loss: Secondary | ICD-10-CM | POA: Diagnosis not present

## 2017-01-19 DIAGNOSIS — E640 Sequelae of protein-calorie malnutrition: Secondary | ICD-10-CM | POA: Diagnosis not present

## 2017-01-19 DIAGNOSIS — I10 Essential (primary) hypertension: Secondary | ICD-10-CM | POA: Diagnosis not present

## 2017-01-19 DIAGNOSIS — E46 Unspecified protein-calorie malnutrition: Secondary | ICD-10-CM | POA: Diagnosis not present

## 2017-01-20 DIAGNOSIS — R630 Anorexia: Secondary | ICD-10-CM | POA: Diagnosis not present

## 2017-01-20 DIAGNOSIS — R634 Abnormal weight loss: Secondary | ICD-10-CM | POA: Diagnosis not present

## 2017-01-20 DIAGNOSIS — E46 Unspecified protein-calorie malnutrition: Secondary | ICD-10-CM | POA: Diagnosis not present

## 2017-01-20 DIAGNOSIS — R54 Age-related physical debility: Secondary | ICD-10-CM | POA: Diagnosis not present

## 2017-01-20 DIAGNOSIS — E640 Sequelae of protein-calorie malnutrition: Secondary | ICD-10-CM | POA: Diagnosis not present

## 2017-01-20 DIAGNOSIS — I10 Essential (primary) hypertension: Secondary | ICD-10-CM | POA: Diagnosis not present

## 2017-01-22 DIAGNOSIS — R54 Age-related physical debility: Secondary | ICD-10-CM | POA: Diagnosis not present

## 2017-01-22 DIAGNOSIS — R634 Abnormal weight loss: Secondary | ICD-10-CM | POA: Diagnosis not present

## 2017-01-22 DIAGNOSIS — E640 Sequelae of protein-calorie malnutrition: Secondary | ICD-10-CM | POA: Diagnosis not present

## 2017-01-22 DIAGNOSIS — E46 Unspecified protein-calorie malnutrition: Secondary | ICD-10-CM | POA: Diagnosis not present

## 2017-01-22 DIAGNOSIS — R630 Anorexia: Secondary | ICD-10-CM | POA: Diagnosis not present

## 2017-01-22 DIAGNOSIS — I10 Essential (primary) hypertension: Secondary | ICD-10-CM | POA: Diagnosis not present

## 2017-01-23 DIAGNOSIS — R54 Age-related physical debility: Secondary | ICD-10-CM | POA: Diagnosis not present

## 2017-01-23 DIAGNOSIS — E640 Sequelae of protein-calorie malnutrition: Secondary | ICD-10-CM | POA: Diagnosis not present

## 2017-01-23 DIAGNOSIS — R634 Abnormal weight loss: Secondary | ICD-10-CM | POA: Diagnosis not present

## 2017-01-23 DIAGNOSIS — E46 Unspecified protein-calorie malnutrition: Secondary | ICD-10-CM | POA: Diagnosis not present

## 2017-01-23 DIAGNOSIS — R630 Anorexia: Secondary | ICD-10-CM | POA: Diagnosis not present

## 2017-01-23 DIAGNOSIS — I10 Essential (primary) hypertension: Secondary | ICD-10-CM | POA: Diagnosis not present

## 2017-01-24 DIAGNOSIS — R634 Abnormal weight loss: Secondary | ICD-10-CM | POA: Diagnosis not present

## 2017-01-24 DIAGNOSIS — E46 Unspecified protein-calorie malnutrition: Secondary | ICD-10-CM | POA: Diagnosis not present

## 2017-01-24 DIAGNOSIS — R630 Anorexia: Secondary | ICD-10-CM | POA: Diagnosis not present

## 2017-01-24 DIAGNOSIS — I10 Essential (primary) hypertension: Secondary | ICD-10-CM | POA: Diagnosis not present

## 2017-01-24 DIAGNOSIS — R54 Age-related physical debility: Secondary | ICD-10-CM | POA: Diagnosis not present

## 2017-01-24 DIAGNOSIS — E640 Sequelae of protein-calorie malnutrition: Secondary | ICD-10-CM | POA: Diagnosis not present

## 2017-01-27 ENCOUNTER — Non-Acute Institutional Stay (SKILLED_NURSING_FACILITY): Admitting: Internal Medicine

## 2017-01-27 ENCOUNTER — Encounter: Payer: Self-pay | Admitting: Internal Medicine

## 2017-01-27 DIAGNOSIS — E43 Unspecified severe protein-calorie malnutrition: Secondary | ICD-10-CM

## 2017-01-27 DIAGNOSIS — R54 Age-related physical debility: Secondary | ICD-10-CM | POA: Diagnosis not present

## 2017-01-27 DIAGNOSIS — D649 Anemia, unspecified: Secondary | ICD-10-CM

## 2017-01-27 DIAGNOSIS — I10 Essential (primary) hypertension: Secondary | ICD-10-CM | POA: Diagnosis not present

## 2017-01-27 DIAGNOSIS — N289 Disorder of kidney and ureter, unspecified: Secondary | ICD-10-CM

## 2017-01-27 DIAGNOSIS — E640 Sequelae of protein-calorie malnutrition: Secondary | ICD-10-CM | POA: Diagnosis not present

## 2017-01-27 DIAGNOSIS — R630 Anorexia: Secondary | ICD-10-CM | POA: Diagnosis not present

## 2017-01-27 DIAGNOSIS — R634 Abnormal weight loss: Secondary | ICD-10-CM | POA: Diagnosis not present

## 2017-01-27 DIAGNOSIS — E46 Unspecified protein-calorie malnutrition: Secondary | ICD-10-CM | POA: Diagnosis not present

## 2017-01-27 NOTE — Assessment & Plan Note (Signed)
BP controlled w/o antihypertensive medications  

## 2017-01-27 NOTE — Progress Notes (Signed)
NURSING HOME LOCATION:  [facility] ROOM NUMBER:  [id number]  CODE STATUS:  DNR  PCP:  Renaye Rakers, MD  [address] [place] [address]    This is a respite admission note to [facility] performed on this date less than 30 days from date of admission.   Included are preadmission medical/surgical history;reconciled medication list; family history; social history and comprehensive review of systems.  Corrections and additions to the records were documented . Comprehensive physical exam was also performed. Additionally a clinical summary was entered for each active diagnosis pertinent to this admission in the Problem List to enhance continuity of care.  HPI: The only medical history received was an F L2 form which lists protein-caloric malnutrition with associated anorexia, weight loss, and cachexia as active diagnoses.Marland Kitchen He is on Tylenol as needed. He is also on topical antifungal/anti-inflammatory creams.  The patient has not been hospitalized since December 2014. Admission at that time was for abdominal pain in the context of protein-caloric malnutrition, severe. This was associated with nausea, vomiting, diarrhea and clinical dehydration. CT imaging suggested colitis/proctitis and possible prostatitis. This was associated with leukocytosis and elevated lactic acidosis. He was subsequently seen in the emergency room 04/12/14 for recurrent falls but not admitted. Last labs were 04/12/14. At that time renal insufficiency was present with a creatinine 1.68, BUN of 43, and GFR 38. He exhibited a normochromic, normocytic anemia with hemoglobin 9.5/hematocrit 27.6. He did have minimal thrombocytopenia with platelet count 149,000. Subsequent to that he has been seen only by his podiatrist. Most recent visit was 12/17/16 for debridement of symptomatic and equal mycosis  Past medical and surgical history: Includes hypertension, dyslipidemia, failure to thrive, congestive heart failure  and peripheral vascular disease. Apparently had a benign bladder tumor removed remotely. He also has a history of pneumothorax on the left  Social history: He is a former smoker having quit in 1973. He does not drink  Family history: No family history found in Epic  Review of systems:Could not be completed due to dementia. Patient was unable to give the date or name of the president. When asked if he were having any issues, his garbled reply was "everything wrong ". He could not elaborate & only stated "I want to go home". His speech was garbled and difficult to discern.  Physical exam:  Pertinent or positive findings: He appears his age and is somewhat cachectic with diffuse muscle wasting of the face and limbs. Pattern alopecia is present. He has arcus senilis. He's wearing complete dentures. S4 gallop is present. He has minor rhonchi. Abdomen scaphoid. Her pulses are decreased. Strength could not be tested as he followed commands poorly. General appearance: no acute distress , increased work of breathing is present.   Lymphatic: No lymphadenopathy about the head, neck, axilla . Eyes: No conjunctival inflammation or lid edema is present. There is no scleral icterus. Ears:  External ear exam shows no significant lesions or deformities.   Nose:  External nasal examination shows no deformity or inflammation. Nasal mucosa are pink and moist without lesions ,exudates Oral exam: lips and gums are healthy appearing.There is no oropharyngeal erythema or exudate . Neck:  No thyromegaly, masses, tenderness noted.    Heart:  Normal rate and regular rhythm. S1 and S2 normal without murmur, click, rub .  Lungs: without wheezes, rales , rubs. Abdomen:Bowel sounds are normal. Abdomen is soft and nontender with no organomegaly, hernias,masses. GU: deferred  Extremities:  No cyanosis,  clubbing,edema  Neurologic exam : Balance,Rhomberg,finger to nose testing could not be completed due to clinical  state Skin: Warm & dry w/o tenting. No significant lesions or rash.  See clinical summary under each active problem in the Problem List with associated updated therapeutic plan

## 2017-01-27 NOTE — Assessment & Plan Note (Signed)
Nutritional supplements as tolerated

## 2017-01-27 NOTE — Patient Instructions (Signed)
See assessment and plan under each diagnosis in the problem list and acutely for this visit 

## 2017-01-28 DIAGNOSIS — I10 Essential (primary) hypertension: Secondary | ICD-10-CM | POA: Diagnosis not present

## 2017-01-28 DIAGNOSIS — E46 Unspecified protein-calorie malnutrition: Secondary | ICD-10-CM | POA: Diagnosis not present

## 2017-01-28 DIAGNOSIS — R634 Abnormal weight loss: Secondary | ICD-10-CM | POA: Diagnosis not present

## 2017-01-28 DIAGNOSIS — D649 Anemia, unspecified: Secondary | ICD-10-CM | POA: Insufficient documentation

## 2017-01-28 DIAGNOSIS — E640 Sequelae of protein-calorie malnutrition: Secondary | ICD-10-CM | POA: Diagnosis not present

## 2017-01-28 DIAGNOSIS — R630 Anorexia: Secondary | ICD-10-CM | POA: Diagnosis not present

## 2017-01-28 DIAGNOSIS — R54 Age-related physical debility: Secondary | ICD-10-CM | POA: Diagnosis not present

## 2017-01-28 DIAGNOSIS — N289 Disorder of kidney and ureter, unspecified: Secondary | ICD-10-CM | POA: Insufficient documentation

## 2017-01-28 NOTE — Assessment & Plan Note (Signed)
No labs unless acute bleeding occurs

## 2017-01-28 NOTE — Assessment & Plan Note (Signed)
meds reviewed ; no nephrotoxic agents

## 2017-01-29 DIAGNOSIS — E46 Unspecified protein-calorie malnutrition: Secondary | ICD-10-CM | POA: Diagnosis not present

## 2017-01-29 DIAGNOSIS — R634 Abnormal weight loss: Secondary | ICD-10-CM | POA: Diagnosis not present

## 2017-01-29 DIAGNOSIS — I10 Essential (primary) hypertension: Secondary | ICD-10-CM | POA: Diagnosis not present

## 2017-01-29 DIAGNOSIS — E640 Sequelae of protein-calorie malnutrition: Secondary | ICD-10-CM | POA: Diagnosis not present

## 2017-01-29 DIAGNOSIS — R54 Age-related physical debility: Secondary | ICD-10-CM | POA: Diagnosis not present

## 2017-01-29 DIAGNOSIS — R630 Anorexia: Secondary | ICD-10-CM | POA: Diagnosis not present

## 2017-01-30 DIAGNOSIS — E640 Sequelae of protein-calorie malnutrition: Secondary | ICD-10-CM | POA: Diagnosis not present

## 2017-01-30 DIAGNOSIS — R634 Abnormal weight loss: Secondary | ICD-10-CM | POA: Diagnosis not present

## 2017-01-30 DIAGNOSIS — E46 Unspecified protein-calorie malnutrition: Secondary | ICD-10-CM | POA: Diagnosis not present

## 2017-01-30 DIAGNOSIS — R630 Anorexia: Secondary | ICD-10-CM | POA: Diagnosis not present

## 2017-01-30 DIAGNOSIS — R54 Age-related physical debility: Secondary | ICD-10-CM | POA: Diagnosis not present

## 2017-01-30 DIAGNOSIS — I10 Essential (primary) hypertension: Secondary | ICD-10-CM | POA: Diagnosis not present

## 2017-02-02 DIAGNOSIS — R54 Age-related physical debility: Secondary | ICD-10-CM | POA: Diagnosis not present

## 2017-02-02 DIAGNOSIS — I10 Essential (primary) hypertension: Secondary | ICD-10-CM | POA: Diagnosis not present

## 2017-02-02 DIAGNOSIS — H04123 Dry eye syndrome of bilateral lacrimal glands: Secondary | ICD-10-CM | POA: Diagnosis not present

## 2017-02-02 DIAGNOSIS — E785 Hyperlipidemia, unspecified: Secondary | ICD-10-CM | POA: Diagnosis not present

## 2017-02-02 DIAGNOSIS — B372 Candidiasis of skin and nail: Secondary | ICD-10-CM | POA: Diagnosis not present

## 2017-02-02 DIAGNOSIS — R630 Anorexia: Secondary | ICD-10-CM | POA: Diagnosis not present

## 2017-02-02 DIAGNOSIS — R634 Abnormal weight loss: Secondary | ICD-10-CM | POA: Diagnosis not present

## 2017-02-02 DIAGNOSIS — E46 Unspecified protein-calorie malnutrition: Secondary | ICD-10-CM | POA: Diagnosis not present

## 2017-02-02 DIAGNOSIS — E640 Sequelae of protein-calorie malnutrition: Secondary | ICD-10-CM | POA: Diagnosis not present

## 2017-02-02 DIAGNOSIS — I739 Peripheral vascular disease, unspecified: Secondary | ICD-10-CM | POA: Diagnosis not present

## 2017-02-02 DIAGNOSIS — D649 Anemia, unspecified: Secondary | ICD-10-CM | POA: Diagnosis not present

## 2017-02-04 DIAGNOSIS — R634 Abnormal weight loss: Secondary | ICD-10-CM | POA: Diagnosis not present

## 2017-02-04 DIAGNOSIS — R54 Age-related physical debility: Secondary | ICD-10-CM | POA: Diagnosis not present

## 2017-02-04 DIAGNOSIS — R630 Anorexia: Secondary | ICD-10-CM | POA: Diagnosis not present

## 2017-02-04 DIAGNOSIS — E46 Unspecified protein-calorie malnutrition: Secondary | ICD-10-CM | POA: Diagnosis not present

## 2017-02-04 DIAGNOSIS — E640 Sequelae of protein-calorie malnutrition: Secondary | ICD-10-CM | POA: Diagnosis not present

## 2017-02-04 DIAGNOSIS — I10 Essential (primary) hypertension: Secondary | ICD-10-CM | POA: Diagnosis not present

## 2017-02-05 DIAGNOSIS — E640 Sequelae of protein-calorie malnutrition: Secondary | ICD-10-CM | POA: Diagnosis not present

## 2017-02-05 DIAGNOSIS — E46 Unspecified protein-calorie malnutrition: Secondary | ICD-10-CM | POA: Diagnosis not present

## 2017-02-05 DIAGNOSIS — I10 Essential (primary) hypertension: Secondary | ICD-10-CM | POA: Diagnosis not present

## 2017-02-05 DIAGNOSIS — R630 Anorexia: Secondary | ICD-10-CM | POA: Diagnosis not present

## 2017-02-05 DIAGNOSIS — R54 Age-related physical debility: Secondary | ICD-10-CM | POA: Diagnosis not present

## 2017-02-05 DIAGNOSIS — R634 Abnormal weight loss: Secondary | ICD-10-CM | POA: Diagnosis not present

## 2017-02-09 DIAGNOSIS — E46 Unspecified protein-calorie malnutrition: Secondary | ICD-10-CM | POA: Diagnosis not present

## 2017-02-09 DIAGNOSIS — I10 Essential (primary) hypertension: Secondary | ICD-10-CM | POA: Diagnosis not present

## 2017-02-09 DIAGNOSIS — R630 Anorexia: Secondary | ICD-10-CM | POA: Diagnosis not present

## 2017-02-09 DIAGNOSIS — R634 Abnormal weight loss: Secondary | ICD-10-CM | POA: Diagnosis not present

## 2017-02-09 DIAGNOSIS — R54 Age-related physical debility: Secondary | ICD-10-CM | POA: Diagnosis not present

## 2017-02-09 DIAGNOSIS — E640 Sequelae of protein-calorie malnutrition: Secondary | ICD-10-CM | POA: Diagnosis not present

## 2017-02-12 DIAGNOSIS — E46 Unspecified protein-calorie malnutrition: Secondary | ICD-10-CM | POA: Diagnosis not present

## 2017-02-12 DIAGNOSIS — E640 Sequelae of protein-calorie malnutrition: Secondary | ICD-10-CM | POA: Diagnosis not present

## 2017-02-12 DIAGNOSIS — R630 Anorexia: Secondary | ICD-10-CM | POA: Diagnosis not present

## 2017-02-12 DIAGNOSIS — R634 Abnormal weight loss: Secondary | ICD-10-CM | POA: Diagnosis not present

## 2017-02-12 DIAGNOSIS — R54 Age-related physical debility: Secondary | ICD-10-CM | POA: Diagnosis not present

## 2017-02-12 DIAGNOSIS — I10 Essential (primary) hypertension: Secondary | ICD-10-CM | POA: Diagnosis not present

## 2017-02-13 DIAGNOSIS — R630 Anorexia: Secondary | ICD-10-CM | POA: Diagnosis not present

## 2017-02-13 DIAGNOSIS — E640 Sequelae of protein-calorie malnutrition: Secondary | ICD-10-CM | POA: Diagnosis not present

## 2017-02-13 DIAGNOSIS — R54 Age-related physical debility: Secondary | ICD-10-CM | POA: Diagnosis not present

## 2017-02-13 DIAGNOSIS — I10 Essential (primary) hypertension: Secondary | ICD-10-CM | POA: Diagnosis not present

## 2017-02-13 DIAGNOSIS — R634 Abnormal weight loss: Secondary | ICD-10-CM | POA: Diagnosis not present

## 2017-02-13 DIAGNOSIS — E46 Unspecified protein-calorie malnutrition: Secondary | ICD-10-CM | POA: Diagnosis not present

## 2017-02-16 DIAGNOSIS — I10 Essential (primary) hypertension: Secondary | ICD-10-CM | POA: Diagnosis not present

## 2017-02-16 DIAGNOSIS — R634 Abnormal weight loss: Secondary | ICD-10-CM | POA: Diagnosis not present

## 2017-02-16 DIAGNOSIS — R630 Anorexia: Secondary | ICD-10-CM | POA: Diagnosis not present

## 2017-02-16 DIAGNOSIS — E46 Unspecified protein-calorie malnutrition: Secondary | ICD-10-CM | POA: Diagnosis not present

## 2017-02-16 DIAGNOSIS — R54 Age-related physical debility: Secondary | ICD-10-CM | POA: Diagnosis not present

## 2017-02-16 DIAGNOSIS — E640 Sequelae of protein-calorie malnutrition: Secondary | ICD-10-CM | POA: Diagnosis not present

## 2017-02-18 DIAGNOSIS — E46 Unspecified protein-calorie malnutrition: Secondary | ICD-10-CM | POA: Diagnosis not present

## 2017-02-18 DIAGNOSIS — R630 Anorexia: Secondary | ICD-10-CM | POA: Diagnosis not present

## 2017-02-18 DIAGNOSIS — E640 Sequelae of protein-calorie malnutrition: Secondary | ICD-10-CM | POA: Diagnosis not present

## 2017-02-18 DIAGNOSIS — I10 Essential (primary) hypertension: Secondary | ICD-10-CM | POA: Diagnosis not present

## 2017-02-18 DIAGNOSIS — R54 Age-related physical debility: Secondary | ICD-10-CM | POA: Diagnosis not present

## 2017-02-18 DIAGNOSIS — R634 Abnormal weight loss: Secondary | ICD-10-CM | POA: Diagnosis not present

## 2017-02-19 DIAGNOSIS — R54 Age-related physical debility: Secondary | ICD-10-CM | POA: Diagnosis not present

## 2017-02-19 DIAGNOSIS — I10 Essential (primary) hypertension: Secondary | ICD-10-CM | POA: Diagnosis not present

## 2017-02-19 DIAGNOSIS — R634 Abnormal weight loss: Secondary | ICD-10-CM | POA: Diagnosis not present

## 2017-02-19 DIAGNOSIS — R630 Anorexia: Secondary | ICD-10-CM | POA: Diagnosis not present

## 2017-02-19 DIAGNOSIS — E640 Sequelae of protein-calorie malnutrition: Secondary | ICD-10-CM | POA: Diagnosis not present

## 2017-02-19 DIAGNOSIS — E46 Unspecified protein-calorie malnutrition: Secondary | ICD-10-CM | POA: Diagnosis not present

## 2017-02-20 DIAGNOSIS — E640 Sequelae of protein-calorie malnutrition: Secondary | ICD-10-CM | POA: Diagnosis not present

## 2017-02-20 DIAGNOSIS — I10 Essential (primary) hypertension: Secondary | ICD-10-CM | POA: Diagnosis not present

## 2017-02-20 DIAGNOSIS — R630 Anorexia: Secondary | ICD-10-CM | POA: Diagnosis not present

## 2017-02-20 DIAGNOSIS — R634 Abnormal weight loss: Secondary | ICD-10-CM | POA: Diagnosis not present

## 2017-02-20 DIAGNOSIS — E46 Unspecified protein-calorie malnutrition: Secondary | ICD-10-CM | POA: Diagnosis not present

## 2017-02-20 DIAGNOSIS — R54 Age-related physical debility: Secondary | ICD-10-CM | POA: Diagnosis not present

## 2017-02-23 DIAGNOSIS — R54 Age-related physical debility: Secondary | ICD-10-CM | POA: Diagnosis not present

## 2017-02-23 DIAGNOSIS — E640 Sequelae of protein-calorie malnutrition: Secondary | ICD-10-CM | POA: Diagnosis not present

## 2017-02-23 DIAGNOSIS — I10 Essential (primary) hypertension: Secondary | ICD-10-CM | POA: Diagnosis not present

## 2017-02-23 DIAGNOSIS — R634 Abnormal weight loss: Secondary | ICD-10-CM | POA: Diagnosis not present

## 2017-02-23 DIAGNOSIS — E46 Unspecified protein-calorie malnutrition: Secondary | ICD-10-CM | POA: Diagnosis not present

## 2017-02-23 DIAGNOSIS — R630 Anorexia: Secondary | ICD-10-CM | POA: Diagnosis not present

## 2017-02-26 DIAGNOSIS — R630 Anorexia: Secondary | ICD-10-CM | POA: Diagnosis not present

## 2017-02-26 DIAGNOSIS — R54 Age-related physical debility: Secondary | ICD-10-CM | POA: Diagnosis not present

## 2017-02-26 DIAGNOSIS — E640 Sequelae of protein-calorie malnutrition: Secondary | ICD-10-CM | POA: Diagnosis not present

## 2017-02-26 DIAGNOSIS — E46 Unspecified protein-calorie malnutrition: Secondary | ICD-10-CM | POA: Diagnosis not present

## 2017-02-26 DIAGNOSIS — R634 Abnormal weight loss: Secondary | ICD-10-CM | POA: Diagnosis not present

## 2017-02-26 DIAGNOSIS — I10 Essential (primary) hypertension: Secondary | ICD-10-CM | POA: Diagnosis not present

## 2017-03-02 DIAGNOSIS — R54 Age-related physical debility: Secondary | ICD-10-CM | POA: Diagnosis not present

## 2017-03-02 DIAGNOSIS — I10 Essential (primary) hypertension: Secondary | ICD-10-CM | POA: Diagnosis not present

## 2017-03-02 DIAGNOSIS — R630 Anorexia: Secondary | ICD-10-CM | POA: Diagnosis not present

## 2017-03-02 DIAGNOSIS — R634 Abnormal weight loss: Secondary | ICD-10-CM | POA: Diagnosis not present

## 2017-03-02 DIAGNOSIS — E640 Sequelae of protein-calorie malnutrition: Secondary | ICD-10-CM | POA: Diagnosis not present

## 2017-03-02 DIAGNOSIS — E46 Unspecified protein-calorie malnutrition: Secondary | ICD-10-CM | POA: Diagnosis not present

## 2017-03-05 DIAGNOSIS — E785 Hyperlipidemia, unspecified: Secondary | ICD-10-CM | POA: Diagnosis not present

## 2017-03-05 DIAGNOSIS — E46 Unspecified protein-calorie malnutrition: Secondary | ICD-10-CM | POA: Diagnosis not present

## 2017-03-05 DIAGNOSIS — R54 Age-related physical debility: Secondary | ICD-10-CM | POA: Diagnosis not present

## 2017-03-05 DIAGNOSIS — E640 Sequelae of protein-calorie malnutrition: Secondary | ICD-10-CM | POA: Diagnosis not present

## 2017-03-05 DIAGNOSIS — B372 Candidiasis of skin and nail: Secondary | ICD-10-CM | POA: Diagnosis not present

## 2017-03-05 DIAGNOSIS — H04123 Dry eye syndrome of bilateral lacrimal glands: Secondary | ICD-10-CM | POA: Diagnosis not present

## 2017-03-05 DIAGNOSIS — R634 Abnormal weight loss: Secondary | ICD-10-CM | POA: Diagnosis not present

## 2017-03-05 DIAGNOSIS — R630 Anorexia: Secondary | ICD-10-CM | POA: Diagnosis not present

## 2017-03-05 DIAGNOSIS — D649 Anemia, unspecified: Secondary | ICD-10-CM | POA: Diagnosis not present

## 2017-03-05 DIAGNOSIS — I739 Peripheral vascular disease, unspecified: Secondary | ICD-10-CM | POA: Diagnosis not present

## 2017-03-05 DIAGNOSIS — I10 Essential (primary) hypertension: Secondary | ICD-10-CM | POA: Diagnosis not present

## 2017-03-07 DIAGNOSIS — E46 Unspecified protein-calorie malnutrition: Secondary | ICD-10-CM | POA: Diagnosis not present

## 2017-03-07 DIAGNOSIS — E640 Sequelae of protein-calorie malnutrition: Secondary | ICD-10-CM | POA: Diagnosis not present

## 2017-03-07 DIAGNOSIS — R630 Anorexia: Secondary | ICD-10-CM | POA: Diagnosis not present

## 2017-03-07 DIAGNOSIS — I10 Essential (primary) hypertension: Secondary | ICD-10-CM | POA: Diagnosis not present

## 2017-03-07 DIAGNOSIS — R634 Abnormal weight loss: Secondary | ICD-10-CM | POA: Diagnosis not present

## 2017-03-07 DIAGNOSIS — R54 Age-related physical debility: Secondary | ICD-10-CM | POA: Diagnosis not present

## 2017-03-08 DIAGNOSIS — E640 Sequelae of protein-calorie malnutrition: Secondary | ICD-10-CM | POA: Diagnosis not present

## 2017-03-08 DIAGNOSIS — E46 Unspecified protein-calorie malnutrition: Secondary | ICD-10-CM | POA: Diagnosis not present

## 2017-03-08 DIAGNOSIS — R54 Age-related physical debility: Secondary | ICD-10-CM | POA: Diagnosis not present

## 2017-03-08 DIAGNOSIS — R630 Anorexia: Secondary | ICD-10-CM | POA: Diagnosis not present

## 2017-03-08 DIAGNOSIS — R634 Abnormal weight loss: Secondary | ICD-10-CM | POA: Diagnosis not present

## 2017-03-08 DIAGNOSIS — I10 Essential (primary) hypertension: Secondary | ICD-10-CM | POA: Diagnosis not present

## 2017-03-09 DIAGNOSIS — E46 Unspecified protein-calorie malnutrition: Secondary | ICD-10-CM | POA: Diagnosis not present

## 2017-03-09 DIAGNOSIS — I10 Essential (primary) hypertension: Secondary | ICD-10-CM | POA: Diagnosis not present

## 2017-03-09 DIAGNOSIS — R630 Anorexia: Secondary | ICD-10-CM | POA: Diagnosis not present

## 2017-03-09 DIAGNOSIS — R54 Age-related physical debility: Secondary | ICD-10-CM | POA: Diagnosis not present

## 2017-03-09 DIAGNOSIS — E640 Sequelae of protein-calorie malnutrition: Secondary | ICD-10-CM | POA: Diagnosis not present

## 2017-03-09 DIAGNOSIS — R634 Abnormal weight loss: Secondary | ICD-10-CM | POA: Diagnosis not present

## 2017-03-10 DIAGNOSIS — I10 Essential (primary) hypertension: Secondary | ICD-10-CM | POA: Diagnosis not present

## 2017-03-10 DIAGNOSIS — E640 Sequelae of protein-calorie malnutrition: Secondary | ICD-10-CM | POA: Diagnosis not present

## 2017-03-10 DIAGNOSIS — R54 Age-related physical debility: Secondary | ICD-10-CM | POA: Diagnosis not present

## 2017-03-10 DIAGNOSIS — R634 Abnormal weight loss: Secondary | ICD-10-CM | POA: Diagnosis not present

## 2017-03-10 DIAGNOSIS — R630 Anorexia: Secondary | ICD-10-CM | POA: Diagnosis not present

## 2017-03-10 DIAGNOSIS — E46 Unspecified protein-calorie malnutrition: Secondary | ICD-10-CM | POA: Diagnosis not present

## 2017-03-11 DIAGNOSIS — R634 Abnormal weight loss: Secondary | ICD-10-CM | POA: Diagnosis not present

## 2017-03-11 DIAGNOSIS — E46 Unspecified protein-calorie malnutrition: Secondary | ICD-10-CM | POA: Diagnosis not present

## 2017-03-11 DIAGNOSIS — E640 Sequelae of protein-calorie malnutrition: Secondary | ICD-10-CM | POA: Diagnosis not present

## 2017-03-11 DIAGNOSIS — I10 Essential (primary) hypertension: Secondary | ICD-10-CM | POA: Diagnosis not present

## 2017-03-11 DIAGNOSIS — R54 Age-related physical debility: Secondary | ICD-10-CM | POA: Diagnosis not present

## 2017-03-11 DIAGNOSIS — R630 Anorexia: Secondary | ICD-10-CM | POA: Diagnosis not present

## 2017-03-12 DIAGNOSIS — E640 Sequelae of protein-calorie malnutrition: Secondary | ICD-10-CM | POA: Diagnosis not present

## 2017-03-12 DIAGNOSIS — I10 Essential (primary) hypertension: Secondary | ICD-10-CM | POA: Diagnosis not present

## 2017-03-12 DIAGNOSIS — E46 Unspecified protein-calorie malnutrition: Secondary | ICD-10-CM | POA: Diagnosis not present

## 2017-03-12 DIAGNOSIS — R630 Anorexia: Secondary | ICD-10-CM | POA: Diagnosis not present

## 2017-03-12 DIAGNOSIS — R634 Abnormal weight loss: Secondary | ICD-10-CM | POA: Diagnosis not present

## 2017-03-12 DIAGNOSIS — R54 Age-related physical debility: Secondary | ICD-10-CM | POA: Diagnosis not present

## 2017-03-13 DIAGNOSIS — R634 Abnormal weight loss: Secondary | ICD-10-CM | POA: Diagnosis not present

## 2017-03-13 DIAGNOSIS — R54 Age-related physical debility: Secondary | ICD-10-CM | POA: Diagnosis not present

## 2017-03-13 DIAGNOSIS — I10 Essential (primary) hypertension: Secondary | ICD-10-CM | POA: Diagnosis not present

## 2017-03-13 DIAGNOSIS — R630 Anorexia: Secondary | ICD-10-CM | POA: Diagnosis not present

## 2017-03-13 DIAGNOSIS — E640 Sequelae of protein-calorie malnutrition: Secondary | ICD-10-CM | POA: Diagnosis not present

## 2017-03-13 DIAGNOSIS — E46 Unspecified protein-calorie malnutrition: Secondary | ICD-10-CM | POA: Diagnosis not present

## 2017-03-16 DIAGNOSIS — R630 Anorexia: Secondary | ICD-10-CM | POA: Diagnosis not present

## 2017-03-16 DIAGNOSIS — R634 Abnormal weight loss: Secondary | ICD-10-CM | POA: Diagnosis not present

## 2017-03-16 DIAGNOSIS — R54 Age-related physical debility: Secondary | ICD-10-CM | POA: Diagnosis not present

## 2017-03-16 DIAGNOSIS — E640 Sequelae of protein-calorie malnutrition: Secondary | ICD-10-CM | POA: Diagnosis not present

## 2017-03-16 DIAGNOSIS — E46 Unspecified protein-calorie malnutrition: Secondary | ICD-10-CM | POA: Diagnosis not present

## 2017-03-16 DIAGNOSIS — I10 Essential (primary) hypertension: Secondary | ICD-10-CM | POA: Diagnosis not present

## 2017-03-17 ENCOUNTER — Ambulatory Visit: Payer: Medicare Other | Admitting: Podiatry

## 2017-03-18 DIAGNOSIS — E640 Sequelae of protein-calorie malnutrition: Secondary | ICD-10-CM | POA: Diagnosis not present

## 2017-03-18 DIAGNOSIS — I10 Essential (primary) hypertension: Secondary | ICD-10-CM | POA: Diagnosis not present

## 2017-03-18 DIAGNOSIS — R630 Anorexia: Secondary | ICD-10-CM | POA: Diagnosis not present

## 2017-03-18 DIAGNOSIS — E46 Unspecified protein-calorie malnutrition: Secondary | ICD-10-CM | POA: Diagnosis not present

## 2017-03-18 DIAGNOSIS — R54 Age-related physical debility: Secondary | ICD-10-CM | POA: Diagnosis not present

## 2017-03-18 DIAGNOSIS — R634 Abnormal weight loss: Secondary | ICD-10-CM | POA: Diagnosis not present

## 2017-03-23 DIAGNOSIS — I10 Essential (primary) hypertension: Secondary | ICD-10-CM | POA: Diagnosis not present

## 2017-03-23 DIAGNOSIS — R630 Anorexia: Secondary | ICD-10-CM | POA: Diagnosis not present

## 2017-03-23 DIAGNOSIS — E640 Sequelae of protein-calorie malnutrition: Secondary | ICD-10-CM | POA: Diagnosis not present

## 2017-03-23 DIAGNOSIS — R634 Abnormal weight loss: Secondary | ICD-10-CM | POA: Diagnosis not present

## 2017-03-23 DIAGNOSIS — E46 Unspecified protein-calorie malnutrition: Secondary | ICD-10-CM | POA: Diagnosis not present

## 2017-03-23 DIAGNOSIS — R54 Age-related physical debility: Secondary | ICD-10-CM | POA: Diagnosis not present

## 2017-03-27 DIAGNOSIS — E46 Unspecified protein-calorie malnutrition: Secondary | ICD-10-CM | POA: Diagnosis not present

## 2017-03-27 DIAGNOSIS — R634 Abnormal weight loss: Secondary | ICD-10-CM | POA: Diagnosis not present

## 2017-03-27 DIAGNOSIS — E640 Sequelae of protein-calorie malnutrition: Secondary | ICD-10-CM | POA: Diagnosis not present

## 2017-03-27 DIAGNOSIS — R630 Anorexia: Secondary | ICD-10-CM | POA: Diagnosis not present

## 2017-03-27 DIAGNOSIS — I10 Essential (primary) hypertension: Secondary | ICD-10-CM | POA: Diagnosis not present

## 2017-03-27 DIAGNOSIS — R54 Age-related physical debility: Secondary | ICD-10-CM | POA: Diagnosis not present

## 2017-03-30 DIAGNOSIS — R634 Abnormal weight loss: Secondary | ICD-10-CM | POA: Diagnosis not present

## 2017-03-30 DIAGNOSIS — I10 Essential (primary) hypertension: Secondary | ICD-10-CM | POA: Diagnosis not present

## 2017-03-30 DIAGNOSIS — R54 Age-related physical debility: Secondary | ICD-10-CM | POA: Diagnosis not present

## 2017-03-30 DIAGNOSIS — E640 Sequelae of protein-calorie malnutrition: Secondary | ICD-10-CM | POA: Diagnosis not present

## 2017-03-30 DIAGNOSIS — E46 Unspecified protein-calorie malnutrition: Secondary | ICD-10-CM | POA: Diagnosis not present

## 2017-03-30 DIAGNOSIS — R630 Anorexia: Secondary | ICD-10-CM | POA: Diagnosis not present

## 2017-04-02 DIAGNOSIS — I10 Essential (primary) hypertension: Secondary | ICD-10-CM | POA: Diagnosis not present

## 2017-04-02 DIAGNOSIS — R630 Anorexia: Secondary | ICD-10-CM | POA: Diagnosis not present

## 2017-04-02 DIAGNOSIS — R634 Abnormal weight loss: Secondary | ICD-10-CM | POA: Diagnosis not present

## 2017-04-02 DIAGNOSIS — R54 Age-related physical debility: Secondary | ICD-10-CM | POA: Diagnosis not present

## 2017-04-02 DIAGNOSIS — E640 Sequelae of protein-calorie malnutrition: Secondary | ICD-10-CM | POA: Diagnosis not present

## 2017-04-02 DIAGNOSIS — E46 Unspecified protein-calorie malnutrition: Secondary | ICD-10-CM | POA: Diagnosis not present

## 2017-04-03 DIAGNOSIS — R54 Age-related physical debility: Secondary | ICD-10-CM | POA: Diagnosis not present

## 2017-04-03 DIAGNOSIS — R630 Anorexia: Secondary | ICD-10-CM | POA: Diagnosis not present

## 2017-04-03 DIAGNOSIS — E640 Sequelae of protein-calorie malnutrition: Secondary | ICD-10-CM | POA: Diagnosis not present

## 2017-04-03 DIAGNOSIS — E46 Unspecified protein-calorie malnutrition: Secondary | ICD-10-CM | POA: Diagnosis not present

## 2017-04-03 DIAGNOSIS — R634 Abnormal weight loss: Secondary | ICD-10-CM | POA: Diagnosis not present

## 2017-04-03 DIAGNOSIS — I10 Essential (primary) hypertension: Secondary | ICD-10-CM | POA: Diagnosis not present

## 2017-04-04 DIAGNOSIS — H04123 Dry eye syndrome of bilateral lacrimal glands: Secondary | ICD-10-CM | POA: Diagnosis not present

## 2017-04-04 DIAGNOSIS — E46 Unspecified protein-calorie malnutrition: Secondary | ICD-10-CM | POA: Diagnosis not present

## 2017-04-04 DIAGNOSIS — E785 Hyperlipidemia, unspecified: Secondary | ICD-10-CM | POA: Diagnosis not present

## 2017-04-04 DIAGNOSIS — R54 Age-related physical debility: Secondary | ICD-10-CM | POA: Diagnosis not present

## 2017-04-04 DIAGNOSIS — E640 Sequelae of protein-calorie malnutrition: Secondary | ICD-10-CM | POA: Diagnosis not present

## 2017-04-04 DIAGNOSIS — R630 Anorexia: Secondary | ICD-10-CM | POA: Diagnosis not present

## 2017-04-04 DIAGNOSIS — R634 Abnormal weight loss: Secondary | ICD-10-CM | POA: Diagnosis not present

## 2017-04-04 DIAGNOSIS — I10 Essential (primary) hypertension: Secondary | ICD-10-CM | POA: Diagnosis not present

## 2017-04-04 DIAGNOSIS — I739 Peripheral vascular disease, unspecified: Secondary | ICD-10-CM | POA: Diagnosis not present

## 2017-04-04 DIAGNOSIS — D649 Anemia, unspecified: Secondary | ICD-10-CM | POA: Diagnosis not present

## 2017-04-04 DIAGNOSIS — B372 Candidiasis of skin and nail: Secondary | ICD-10-CM | POA: Diagnosis not present

## 2017-04-06 DIAGNOSIS — R634 Abnormal weight loss: Secondary | ICD-10-CM | POA: Diagnosis not present

## 2017-04-06 DIAGNOSIS — I10 Essential (primary) hypertension: Secondary | ICD-10-CM | POA: Diagnosis not present

## 2017-04-06 DIAGNOSIS — R630 Anorexia: Secondary | ICD-10-CM | POA: Diagnosis not present

## 2017-04-06 DIAGNOSIS — E46 Unspecified protein-calorie malnutrition: Secondary | ICD-10-CM | POA: Diagnosis not present

## 2017-04-06 DIAGNOSIS — E640 Sequelae of protein-calorie malnutrition: Secondary | ICD-10-CM | POA: Diagnosis not present

## 2017-04-06 DIAGNOSIS — R54 Age-related physical debility: Secondary | ICD-10-CM | POA: Diagnosis not present

## 2017-04-09 DIAGNOSIS — R634 Abnormal weight loss: Secondary | ICD-10-CM | POA: Diagnosis not present

## 2017-04-09 DIAGNOSIS — R54 Age-related physical debility: Secondary | ICD-10-CM | POA: Diagnosis not present

## 2017-04-09 DIAGNOSIS — R630 Anorexia: Secondary | ICD-10-CM | POA: Diagnosis not present

## 2017-04-09 DIAGNOSIS — E640 Sequelae of protein-calorie malnutrition: Secondary | ICD-10-CM | POA: Diagnosis not present

## 2017-04-09 DIAGNOSIS — E46 Unspecified protein-calorie malnutrition: Secondary | ICD-10-CM | POA: Diagnosis not present

## 2017-04-09 DIAGNOSIS — I10 Essential (primary) hypertension: Secondary | ICD-10-CM | POA: Diagnosis not present

## 2017-04-10 DIAGNOSIS — E640 Sequelae of protein-calorie malnutrition: Secondary | ICD-10-CM | POA: Diagnosis not present

## 2017-04-10 DIAGNOSIS — R54 Age-related physical debility: Secondary | ICD-10-CM | POA: Diagnosis not present

## 2017-04-10 DIAGNOSIS — R634 Abnormal weight loss: Secondary | ICD-10-CM | POA: Diagnosis not present

## 2017-04-10 DIAGNOSIS — E46 Unspecified protein-calorie malnutrition: Secondary | ICD-10-CM | POA: Diagnosis not present

## 2017-04-10 DIAGNOSIS — R630 Anorexia: Secondary | ICD-10-CM | POA: Diagnosis not present

## 2017-04-10 DIAGNOSIS — I10 Essential (primary) hypertension: Secondary | ICD-10-CM | POA: Diagnosis not present

## 2017-04-15 DIAGNOSIS — R54 Age-related physical debility: Secondary | ICD-10-CM | POA: Diagnosis not present

## 2017-04-15 DIAGNOSIS — R634 Abnormal weight loss: Secondary | ICD-10-CM | POA: Diagnosis not present

## 2017-04-15 DIAGNOSIS — E46 Unspecified protein-calorie malnutrition: Secondary | ICD-10-CM | POA: Diagnosis not present

## 2017-04-15 DIAGNOSIS — R630 Anorexia: Secondary | ICD-10-CM | POA: Diagnosis not present

## 2017-04-15 DIAGNOSIS — E640 Sequelae of protein-calorie malnutrition: Secondary | ICD-10-CM | POA: Diagnosis not present

## 2017-04-15 DIAGNOSIS — I10 Essential (primary) hypertension: Secondary | ICD-10-CM | POA: Diagnosis not present

## 2017-04-16 DIAGNOSIS — R54 Age-related physical debility: Secondary | ICD-10-CM | POA: Diagnosis not present

## 2017-04-16 DIAGNOSIS — R634 Abnormal weight loss: Secondary | ICD-10-CM | POA: Diagnosis not present

## 2017-04-16 DIAGNOSIS — I10 Essential (primary) hypertension: Secondary | ICD-10-CM | POA: Diagnosis not present

## 2017-04-16 DIAGNOSIS — E640 Sequelae of protein-calorie malnutrition: Secondary | ICD-10-CM | POA: Diagnosis not present

## 2017-04-16 DIAGNOSIS — R630 Anorexia: Secondary | ICD-10-CM | POA: Diagnosis not present

## 2017-04-16 DIAGNOSIS — E46 Unspecified protein-calorie malnutrition: Secondary | ICD-10-CM | POA: Diagnosis not present

## 2017-04-20 DIAGNOSIS — E640 Sequelae of protein-calorie malnutrition: Secondary | ICD-10-CM | POA: Diagnosis not present

## 2017-04-20 DIAGNOSIS — E46 Unspecified protein-calorie malnutrition: Secondary | ICD-10-CM | POA: Diagnosis not present

## 2017-04-20 DIAGNOSIS — I10 Essential (primary) hypertension: Secondary | ICD-10-CM | POA: Diagnosis not present

## 2017-04-20 DIAGNOSIS — R630 Anorexia: Secondary | ICD-10-CM | POA: Diagnosis not present

## 2017-04-20 DIAGNOSIS — R54 Age-related physical debility: Secondary | ICD-10-CM | POA: Diagnosis not present

## 2017-04-20 DIAGNOSIS — R634 Abnormal weight loss: Secondary | ICD-10-CM | POA: Diagnosis not present

## 2017-04-21 DIAGNOSIS — R634 Abnormal weight loss: Secondary | ICD-10-CM | POA: Diagnosis not present

## 2017-04-21 DIAGNOSIS — E640 Sequelae of protein-calorie malnutrition: Secondary | ICD-10-CM | POA: Diagnosis not present

## 2017-04-21 DIAGNOSIS — I10 Essential (primary) hypertension: Secondary | ICD-10-CM | POA: Diagnosis not present

## 2017-04-21 DIAGNOSIS — E46 Unspecified protein-calorie malnutrition: Secondary | ICD-10-CM | POA: Diagnosis not present

## 2017-04-21 DIAGNOSIS — R54 Age-related physical debility: Secondary | ICD-10-CM | POA: Diagnosis not present

## 2017-04-21 DIAGNOSIS — R630 Anorexia: Secondary | ICD-10-CM | POA: Diagnosis not present

## 2017-04-23 DIAGNOSIS — R634 Abnormal weight loss: Secondary | ICD-10-CM | POA: Diagnosis not present

## 2017-04-23 DIAGNOSIS — R54 Age-related physical debility: Secondary | ICD-10-CM | POA: Diagnosis not present

## 2017-04-23 DIAGNOSIS — E640 Sequelae of protein-calorie malnutrition: Secondary | ICD-10-CM | POA: Diagnosis not present

## 2017-04-23 DIAGNOSIS — I10 Essential (primary) hypertension: Secondary | ICD-10-CM | POA: Diagnosis not present

## 2017-04-23 DIAGNOSIS — R630 Anorexia: Secondary | ICD-10-CM | POA: Diagnosis not present

## 2017-04-23 DIAGNOSIS — E46 Unspecified protein-calorie malnutrition: Secondary | ICD-10-CM | POA: Diagnosis not present

## 2017-04-24 DIAGNOSIS — R54 Age-related physical debility: Secondary | ICD-10-CM | POA: Diagnosis not present

## 2017-04-24 DIAGNOSIS — R630 Anorexia: Secondary | ICD-10-CM | POA: Diagnosis not present

## 2017-04-24 DIAGNOSIS — E640 Sequelae of protein-calorie malnutrition: Secondary | ICD-10-CM | POA: Diagnosis not present

## 2017-04-24 DIAGNOSIS — I10 Essential (primary) hypertension: Secondary | ICD-10-CM | POA: Diagnosis not present

## 2017-04-24 DIAGNOSIS — R634 Abnormal weight loss: Secondary | ICD-10-CM | POA: Diagnosis not present

## 2017-04-24 DIAGNOSIS — E46 Unspecified protein-calorie malnutrition: Secondary | ICD-10-CM | POA: Diagnosis not present

## 2017-04-27 DIAGNOSIS — E640 Sequelae of protein-calorie malnutrition: Secondary | ICD-10-CM | POA: Diagnosis not present

## 2017-04-27 DIAGNOSIS — I10 Essential (primary) hypertension: Secondary | ICD-10-CM | POA: Diagnosis not present

## 2017-04-27 DIAGNOSIS — E46 Unspecified protein-calorie malnutrition: Secondary | ICD-10-CM | POA: Diagnosis not present

## 2017-04-27 DIAGNOSIS — R634 Abnormal weight loss: Secondary | ICD-10-CM | POA: Diagnosis not present

## 2017-04-27 DIAGNOSIS — R54 Age-related physical debility: Secondary | ICD-10-CM | POA: Diagnosis not present

## 2017-04-27 DIAGNOSIS — R630 Anorexia: Secondary | ICD-10-CM | POA: Diagnosis not present

## 2017-04-30 DIAGNOSIS — R54 Age-related physical debility: Secondary | ICD-10-CM | POA: Diagnosis not present

## 2017-04-30 DIAGNOSIS — R634 Abnormal weight loss: Secondary | ICD-10-CM | POA: Diagnosis not present

## 2017-04-30 DIAGNOSIS — E640 Sequelae of protein-calorie malnutrition: Secondary | ICD-10-CM | POA: Diagnosis not present

## 2017-04-30 DIAGNOSIS — I10 Essential (primary) hypertension: Secondary | ICD-10-CM | POA: Diagnosis not present

## 2017-04-30 DIAGNOSIS — R630 Anorexia: Secondary | ICD-10-CM | POA: Diagnosis not present

## 2017-04-30 DIAGNOSIS — E46 Unspecified protein-calorie malnutrition: Secondary | ICD-10-CM | POA: Diagnosis not present

## 2017-05-01 ENCOUNTER — Telehealth: Payer: Self-pay | Admitting: Podiatry

## 2017-05-01 DIAGNOSIS — E46 Unspecified protein-calorie malnutrition: Secondary | ICD-10-CM | POA: Diagnosis not present

## 2017-05-01 DIAGNOSIS — R54 Age-related physical debility: Secondary | ICD-10-CM | POA: Diagnosis not present

## 2017-05-01 DIAGNOSIS — R630 Anorexia: Secondary | ICD-10-CM | POA: Diagnosis not present

## 2017-05-01 DIAGNOSIS — E640 Sequelae of protein-calorie malnutrition: Secondary | ICD-10-CM | POA: Diagnosis not present

## 2017-05-01 DIAGNOSIS — I10 Essential (primary) hypertension: Secondary | ICD-10-CM | POA: Diagnosis not present

## 2017-05-01 DIAGNOSIS — R634 Abnormal weight loss: Secondary | ICD-10-CM | POA: Diagnosis not present

## 2017-05-01 NOTE — Telephone Encounter (Signed)
Left message to call again with questions.

## 2017-05-01 NOTE — Telephone Encounter (Signed)
This is Marcus AltesPatricia Clark, caregiver for [name]. Could the nurse give me a call back, I have a question I need to ask about his toenails. She can reach me at [phone]. Thank you.

## 2017-05-01 NOTE — Telephone Encounter (Signed)
Marcus Clark states she is looking for someone to come out to trim pt's toenails. Dr. Ardelle AntonWagoner states give pt's caregiver, Marcus Clark [phone] for referral to Dr. Geralynn RileJennifer Clark. Left message with the information for [facility].

## 2017-05-04 DIAGNOSIS — R54 Age-related physical debility: Secondary | ICD-10-CM | POA: Diagnosis not present

## 2017-05-04 DIAGNOSIS — E46 Unspecified protein-calorie malnutrition: Secondary | ICD-10-CM | POA: Diagnosis not present

## 2017-05-04 DIAGNOSIS — R630 Anorexia: Secondary | ICD-10-CM | POA: Diagnosis not present

## 2017-05-04 DIAGNOSIS — I10 Essential (primary) hypertension: Secondary | ICD-10-CM | POA: Diagnosis not present

## 2017-05-04 DIAGNOSIS — E640 Sequelae of protein-calorie malnutrition: Secondary | ICD-10-CM | POA: Diagnosis not present

## 2017-05-04 DIAGNOSIS — R634 Abnormal weight loss: Secondary | ICD-10-CM | POA: Diagnosis not present

## 2017-05-05 DIAGNOSIS — E640 Sequelae of protein-calorie malnutrition: Secondary | ICD-10-CM | POA: Diagnosis not present

## 2017-05-05 DIAGNOSIS — D649 Anemia, unspecified: Secondary | ICD-10-CM | POA: Diagnosis not present

## 2017-05-05 DIAGNOSIS — H04123 Dry eye syndrome of bilateral lacrimal glands: Secondary | ICD-10-CM | POA: Diagnosis not present

## 2017-05-05 DIAGNOSIS — E785 Hyperlipidemia, unspecified: Secondary | ICD-10-CM | POA: Diagnosis not present

## 2017-05-05 DIAGNOSIS — I739 Peripheral vascular disease, unspecified: Secondary | ICD-10-CM | POA: Diagnosis not present

## 2017-05-05 DIAGNOSIS — B372 Candidiasis of skin and nail: Secondary | ICD-10-CM | POA: Diagnosis not present

## 2017-05-05 DIAGNOSIS — R630 Anorexia: Secondary | ICD-10-CM | POA: Diagnosis not present

## 2017-05-05 DIAGNOSIS — R634 Abnormal weight loss: Secondary | ICD-10-CM | POA: Diagnosis not present

## 2017-05-05 DIAGNOSIS — I10 Essential (primary) hypertension: Secondary | ICD-10-CM | POA: Diagnosis not present

## 2017-05-05 DIAGNOSIS — R54 Age-related physical debility: Secondary | ICD-10-CM | POA: Diagnosis not present

## 2017-05-05 DIAGNOSIS — E46 Unspecified protein-calorie malnutrition: Secondary | ICD-10-CM | POA: Diagnosis not present

## 2017-05-07 DIAGNOSIS — R54 Age-related physical debility: Secondary | ICD-10-CM | POA: Diagnosis not present

## 2017-05-07 DIAGNOSIS — R630 Anorexia: Secondary | ICD-10-CM | POA: Diagnosis not present

## 2017-05-07 DIAGNOSIS — I10 Essential (primary) hypertension: Secondary | ICD-10-CM | POA: Diagnosis not present

## 2017-05-07 DIAGNOSIS — E640 Sequelae of protein-calorie malnutrition: Secondary | ICD-10-CM | POA: Diagnosis not present

## 2017-05-07 DIAGNOSIS — E46 Unspecified protein-calorie malnutrition: Secondary | ICD-10-CM | POA: Diagnosis not present

## 2017-05-07 DIAGNOSIS — R634 Abnormal weight loss: Secondary | ICD-10-CM | POA: Diagnosis not present

## 2017-05-09 DIAGNOSIS — E640 Sequelae of protein-calorie malnutrition: Secondary | ICD-10-CM | POA: Diagnosis not present

## 2017-05-09 DIAGNOSIS — E46 Unspecified protein-calorie malnutrition: Secondary | ICD-10-CM | POA: Diagnosis not present

## 2017-05-09 DIAGNOSIS — R634 Abnormal weight loss: Secondary | ICD-10-CM | POA: Diagnosis not present

## 2017-05-09 DIAGNOSIS — R54 Age-related physical debility: Secondary | ICD-10-CM | POA: Diagnosis not present

## 2017-05-09 DIAGNOSIS — I10 Essential (primary) hypertension: Secondary | ICD-10-CM | POA: Diagnosis not present

## 2017-05-09 DIAGNOSIS — R630 Anorexia: Secondary | ICD-10-CM | POA: Diagnosis not present

## 2017-05-11 DIAGNOSIS — E640 Sequelae of protein-calorie malnutrition: Secondary | ICD-10-CM | POA: Diagnosis not present

## 2017-05-11 DIAGNOSIS — I10 Essential (primary) hypertension: Secondary | ICD-10-CM | POA: Diagnosis not present

## 2017-05-11 DIAGNOSIS — R630 Anorexia: Secondary | ICD-10-CM | POA: Diagnosis not present

## 2017-05-11 DIAGNOSIS — E46 Unspecified protein-calorie malnutrition: Secondary | ICD-10-CM | POA: Diagnosis not present

## 2017-05-11 DIAGNOSIS — R634 Abnormal weight loss: Secondary | ICD-10-CM | POA: Diagnosis not present

## 2017-05-11 DIAGNOSIS — R54 Age-related physical debility: Secondary | ICD-10-CM | POA: Diagnosis not present

## 2017-05-12 DIAGNOSIS — R54 Age-related physical debility: Secondary | ICD-10-CM | POA: Diagnosis not present

## 2017-05-12 DIAGNOSIS — R634 Abnormal weight loss: Secondary | ICD-10-CM | POA: Diagnosis not present

## 2017-05-12 DIAGNOSIS — E640 Sequelae of protein-calorie malnutrition: Secondary | ICD-10-CM | POA: Diagnosis not present

## 2017-05-12 DIAGNOSIS — I10 Essential (primary) hypertension: Secondary | ICD-10-CM | POA: Diagnosis not present

## 2017-05-12 DIAGNOSIS — E46 Unspecified protein-calorie malnutrition: Secondary | ICD-10-CM | POA: Diagnosis not present

## 2017-05-12 DIAGNOSIS — R630 Anorexia: Secondary | ICD-10-CM | POA: Diagnosis not present

## 2017-05-13 DIAGNOSIS — R630 Anorexia: Secondary | ICD-10-CM | POA: Diagnosis not present

## 2017-05-13 DIAGNOSIS — R54 Age-related physical debility: Secondary | ICD-10-CM | POA: Diagnosis not present

## 2017-05-13 DIAGNOSIS — I10 Essential (primary) hypertension: Secondary | ICD-10-CM | POA: Diagnosis not present

## 2017-05-13 DIAGNOSIS — E46 Unspecified protein-calorie malnutrition: Secondary | ICD-10-CM | POA: Diagnosis not present

## 2017-05-13 DIAGNOSIS — E640 Sequelae of protein-calorie malnutrition: Secondary | ICD-10-CM | POA: Diagnosis not present

## 2017-05-13 DIAGNOSIS — R634 Abnormal weight loss: Secondary | ICD-10-CM | POA: Diagnosis not present

## 2017-05-14 DIAGNOSIS — E46 Unspecified protein-calorie malnutrition: Secondary | ICD-10-CM | POA: Diagnosis not present

## 2017-05-14 DIAGNOSIS — I10 Essential (primary) hypertension: Secondary | ICD-10-CM | POA: Diagnosis not present

## 2017-05-14 DIAGNOSIS — E640 Sequelae of protein-calorie malnutrition: Secondary | ICD-10-CM | POA: Diagnosis not present

## 2017-05-14 DIAGNOSIS — R630 Anorexia: Secondary | ICD-10-CM | POA: Diagnosis not present

## 2017-05-14 DIAGNOSIS — R634 Abnormal weight loss: Secondary | ICD-10-CM | POA: Diagnosis not present

## 2017-05-14 DIAGNOSIS — R54 Age-related physical debility: Secondary | ICD-10-CM | POA: Diagnosis not present

## 2017-05-15 DIAGNOSIS — R630 Anorexia: Secondary | ICD-10-CM | POA: Diagnosis not present

## 2017-05-15 DIAGNOSIS — I10 Essential (primary) hypertension: Secondary | ICD-10-CM | POA: Diagnosis not present

## 2017-05-15 DIAGNOSIS — E46 Unspecified protein-calorie malnutrition: Secondary | ICD-10-CM | POA: Diagnosis not present

## 2017-05-15 DIAGNOSIS — R634 Abnormal weight loss: Secondary | ICD-10-CM | POA: Diagnosis not present

## 2017-05-15 DIAGNOSIS — R54 Age-related physical debility: Secondary | ICD-10-CM | POA: Diagnosis not present

## 2017-05-15 DIAGNOSIS — E640 Sequelae of protein-calorie malnutrition: Secondary | ICD-10-CM | POA: Diagnosis not present

## 2017-05-16 DIAGNOSIS — E46 Unspecified protein-calorie malnutrition: Secondary | ICD-10-CM | POA: Diagnosis not present

## 2017-05-16 DIAGNOSIS — R630 Anorexia: Secondary | ICD-10-CM | POA: Diagnosis not present

## 2017-05-16 DIAGNOSIS — I10 Essential (primary) hypertension: Secondary | ICD-10-CM | POA: Diagnosis not present

## 2017-05-16 DIAGNOSIS — E640 Sequelae of protein-calorie malnutrition: Secondary | ICD-10-CM | POA: Diagnosis not present

## 2017-05-16 DIAGNOSIS — R54 Age-related physical debility: Secondary | ICD-10-CM | POA: Diagnosis not present

## 2017-05-16 DIAGNOSIS — R634 Abnormal weight loss: Secondary | ICD-10-CM | POA: Diagnosis not present

## 2017-05-17 DIAGNOSIS — E46 Unspecified protein-calorie malnutrition: Secondary | ICD-10-CM | POA: Diagnosis not present

## 2017-05-17 DIAGNOSIS — R630 Anorexia: Secondary | ICD-10-CM | POA: Diagnosis not present

## 2017-05-17 DIAGNOSIS — E640 Sequelae of protein-calorie malnutrition: Secondary | ICD-10-CM | POA: Diagnosis not present

## 2017-05-17 DIAGNOSIS — R54 Age-related physical debility: Secondary | ICD-10-CM | POA: Diagnosis not present

## 2017-05-17 DIAGNOSIS — I10 Essential (primary) hypertension: Secondary | ICD-10-CM | POA: Diagnosis not present

## 2017-05-17 DIAGNOSIS — R634 Abnormal weight loss: Secondary | ICD-10-CM | POA: Diagnosis not present

## 2017-05-18 DIAGNOSIS — R630 Anorexia: Secondary | ICD-10-CM | POA: Diagnosis not present

## 2017-05-18 DIAGNOSIS — R634 Abnormal weight loss: Secondary | ICD-10-CM | POA: Diagnosis not present

## 2017-05-18 DIAGNOSIS — E640 Sequelae of protein-calorie malnutrition: Secondary | ICD-10-CM | POA: Diagnosis not present

## 2017-05-18 DIAGNOSIS — R54 Age-related physical debility: Secondary | ICD-10-CM | POA: Diagnosis not present

## 2017-05-18 DIAGNOSIS — I10 Essential (primary) hypertension: Secondary | ICD-10-CM | POA: Diagnosis not present

## 2017-05-18 DIAGNOSIS — E46 Unspecified protein-calorie malnutrition: Secondary | ICD-10-CM | POA: Diagnosis not present

## 2017-05-19 DIAGNOSIS — E640 Sequelae of protein-calorie malnutrition: Secondary | ICD-10-CM | POA: Diagnosis not present

## 2017-05-19 DIAGNOSIS — R630 Anorexia: Secondary | ICD-10-CM | POA: Diagnosis not present

## 2017-05-19 DIAGNOSIS — I10 Essential (primary) hypertension: Secondary | ICD-10-CM | POA: Diagnosis not present

## 2017-05-19 DIAGNOSIS — E46 Unspecified protein-calorie malnutrition: Secondary | ICD-10-CM | POA: Diagnosis not present

## 2017-05-19 DIAGNOSIS — R634 Abnormal weight loss: Secondary | ICD-10-CM | POA: Diagnosis not present

## 2017-05-19 DIAGNOSIS — R54 Age-related physical debility: Secondary | ICD-10-CM | POA: Diagnosis not present

## 2017-05-21 DIAGNOSIS — R54 Age-related physical debility: Secondary | ICD-10-CM | POA: Diagnosis not present

## 2017-05-21 DIAGNOSIS — E640 Sequelae of protein-calorie malnutrition: Secondary | ICD-10-CM | POA: Diagnosis not present

## 2017-05-21 DIAGNOSIS — I10 Essential (primary) hypertension: Secondary | ICD-10-CM | POA: Diagnosis not present

## 2017-05-21 DIAGNOSIS — E46 Unspecified protein-calorie malnutrition: Secondary | ICD-10-CM | POA: Diagnosis not present

## 2017-05-21 DIAGNOSIS — R634 Abnormal weight loss: Secondary | ICD-10-CM | POA: Diagnosis not present

## 2017-05-21 DIAGNOSIS — R630 Anorexia: Secondary | ICD-10-CM | POA: Diagnosis not present

## 2017-05-25 DIAGNOSIS — E640 Sequelae of protein-calorie malnutrition: Secondary | ICD-10-CM | POA: Diagnosis not present

## 2017-05-25 DIAGNOSIS — R54 Age-related physical debility: Secondary | ICD-10-CM | POA: Diagnosis not present

## 2017-05-25 DIAGNOSIS — R630 Anorexia: Secondary | ICD-10-CM | POA: Diagnosis not present

## 2017-05-25 DIAGNOSIS — I10 Essential (primary) hypertension: Secondary | ICD-10-CM | POA: Diagnosis not present

## 2017-05-25 DIAGNOSIS — E46 Unspecified protein-calorie malnutrition: Secondary | ICD-10-CM | POA: Diagnosis not present

## 2017-05-25 DIAGNOSIS — R634 Abnormal weight loss: Secondary | ICD-10-CM | POA: Diagnosis not present

## 2017-05-26 DIAGNOSIS — I10 Essential (primary) hypertension: Secondary | ICD-10-CM | POA: Diagnosis not present

## 2017-05-26 DIAGNOSIS — R630 Anorexia: Secondary | ICD-10-CM | POA: Diagnosis not present

## 2017-05-26 DIAGNOSIS — E640 Sequelae of protein-calorie malnutrition: Secondary | ICD-10-CM | POA: Diagnosis not present

## 2017-05-26 DIAGNOSIS — E46 Unspecified protein-calorie malnutrition: Secondary | ICD-10-CM | POA: Diagnosis not present

## 2017-05-26 DIAGNOSIS — R54 Age-related physical debility: Secondary | ICD-10-CM | POA: Diagnosis not present

## 2017-05-26 DIAGNOSIS — R634 Abnormal weight loss: Secondary | ICD-10-CM | POA: Diagnosis not present

## 2017-05-28 DIAGNOSIS — I10 Essential (primary) hypertension: Secondary | ICD-10-CM | POA: Diagnosis not present

## 2017-05-28 DIAGNOSIS — E46 Unspecified protein-calorie malnutrition: Secondary | ICD-10-CM | POA: Diagnosis not present

## 2017-05-28 DIAGNOSIS — R630 Anorexia: Secondary | ICD-10-CM | POA: Diagnosis not present

## 2017-05-28 DIAGNOSIS — E640 Sequelae of protein-calorie malnutrition: Secondary | ICD-10-CM | POA: Diagnosis not present

## 2017-05-28 DIAGNOSIS — R54 Age-related physical debility: Secondary | ICD-10-CM | POA: Diagnosis not present

## 2017-05-28 DIAGNOSIS — R634 Abnormal weight loss: Secondary | ICD-10-CM | POA: Diagnosis not present

## 2017-06-01 DIAGNOSIS — R630 Anorexia: Secondary | ICD-10-CM | POA: Diagnosis not present

## 2017-06-01 DIAGNOSIS — R634 Abnormal weight loss: Secondary | ICD-10-CM | POA: Diagnosis not present

## 2017-06-01 DIAGNOSIS — R54 Age-related physical debility: Secondary | ICD-10-CM | POA: Diagnosis not present

## 2017-06-01 DIAGNOSIS — E640 Sequelae of protein-calorie malnutrition: Secondary | ICD-10-CM | POA: Diagnosis not present

## 2017-06-01 DIAGNOSIS — E46 Unspecified protein-calorie malnutrition: Secondary | ICD-10-CM | POA: Diagnosis not present

## 2017-06-01 DIAGNOSIS — I10 Essential (primary) hypertension: Secondary | ICD-10-CM | POA: Diagnosis not present

## 2017-06-04 DIAGNOSIS — R630 Anorexia: Secondary | ICD-10-CM | POA: Diagnosis not present

## 2017-06-04 DIAGNOSIS — I10 Essential (primary) hypertension: Secondary | ICD-10-CM | POA: Diagnosis not present

## 2017-06-04 DIAGNOSIS — E46 Unspecified protein-calorie malnutrition: Secondary | ICD-10-CM | POA: Diagnosis not present

## 2017-06-04 DIAGNOSIS — R634 Abnormal weight loss: Secondary | ICD-10-CM | POA: Diagnosis not present

## 2017-06-04 DIAGNOSIS — E640 Sequelae of protein-calorie malnutrition: Secondary | ICD-10-CM | POA: Diagnosis not present

## 2017-06-04 DIAGNOSIS — R54 Age-related physical debility: Secondary | ICD-10-CM | POA: Diagnosis not present

## 2017-06-05 DIAGNOSIS — B372 Candidiasis of skin and nail: Secondary | ICD-10-CM | POA: Diagnosis not present

## 2017-06-05 DIAGNOSIS — R54 Age-related physical debility: Secondary | ICD-10-CM | POA: Diagnosis not present

## 2017-06-05 DIAGNOSIS — R634 Abnormal weight loss: Secondary | ICD-10-CM | POA: Diagnosis not present

## 2017-06-05 DIAGNOSIS — I739 Peripheral vascular disease, unspecified: Secondary | ICD-10-CM | POA: Diagnosis not present

## 2017-06-05 DIAGNOSIS — I10 Essential (primary) hypertension: Secondary | ICD-10-CM | POA: Diagnosis not present

## 2017-06-05 DIAGNOSIS — R630 Anorexia: Secondary | ICD-10-CM | POA: Diagnosis not present

## 2017-06-05 DIAGNOSIS — H04123 Dry eye syndrome of bilateral lacrimal glands: Secondary | ICD-10-CM | POA: Diagnosis not present

## 2017-06-05 DIAGNOSIS — D649 Anemia, unspecified: Secondary | ICD-10-CM | POA: Diagnosis not present

## 2017-06-05 DIAGNOSIS — E785 Hyperlipidemia, unspecified: Secondary | ICD-10-CM | POA: Diagnosis not present

## 2017-06-05 DIAGNOSIS — E46 Unspecified protein-calorie malnutrition: Secondary | ICD-10-CM | POA: Diagnosis not present

## 2017-06-05 DIAGNOSIS — E640 Sequelae of protein-calorie malnutrition: Secondary | ICD-10-CM | POA: Diagnosis not present

## 2017-06-08 DIAGNOSIS — I10 Essential (primary) hypertension: Secondary | ICD-10-CM | POA: Diagnosis not present

## 2017-06-08 DIAGNOSIS — R630 Anorexia: Secondary | ICD-10-CM | POA: Diagnosis not present

## 2017-06-08 DIAGNOSIS — R54 Age-related physical debility: Secondary | ICD-10-CM | POA: Diagnosis not present

## 2017-06-08 DIAGNOSIS — R634 Abnormal weight loss: Secondary | ICD-10-CM | POA: Diagnosis not present

## 2017-06-08 DIAGNOSIS — E640 Sequelae of protein-calorie malnutrition: Secondary | ICD-10-CM | POA: Diagnosis not present

## 2017-06-08 DIAGNOSIS — E46 Unspecified protein-calorie malnutrition: Secondary | ICD-10-CM | POA: Diagnosis not present

## 2017-06-10 DIAGNOSIS — R634 Abnormal weight loss: Secondary | ICD-10-CM | POA: Diagnosis not present

## 2017-06-10 DIAGNOSIS — E640 Sequelae of protein-calorie malnutrition: Secondary | ICD-10-CM | POA: Diagnosis not present

## 2017-06-10 DIAGNOSIS — R630 Anorexia: Secondary | ICD-10-CM | POA: Diagnosis not present

## 2017-06-10 DIAGNOSIS — E46 Unspecified protein-calorie malnutrition: Secondary | ICD-10-CM | POA: Diagnosis not present

## 2017-06-10 DIAGNOSIS — R54 Age-related physical debility: Secondary | ICD-10-CM | POA: Diagnosis not present

## 2017-06-10 DIAGNOSIS — I10 Essential (primary) hypertension: Secondary | ICD-10-CM | POA: Diagnosis not present

## 2017-06-13 DIAGNOSIS — R54 Age-related physical debility: Secondary | ICD-10-CM | POA: Diagnosis not present

## 2017-06-13 DIAGNOSIS — I10 Essential (primary) hypertension: Secondary | ICD-10-CM | POA: Diagnosis not present

## 2017-06-13 DIAGNOSIS — R630 Anorexia: Secondary | ICD-10-CM | POA: Diagnosis not present

## 2017-06-13 DIAGNOSIS — E46 Unspecified protein-calorie malnutrition: Secondary | ICD-10-CM | POA: Diagnosis not present

## 2017-06-13 DIAGNOSIS — R634 Abnormal weight loss: Secondary | ICD-10-CM | POA: Diagnosis not present

## 2017-06-13 DIAGNOSIS — E640 Sequelae of protein-calorie malnutrition: Secondary | ICD-10-CM | POA: Diagnosis not present

## 2017-06-14 DIAGNOSIS — R630 Anorexia: Secondary | ICD-10-CM | POA: Diagnosis not present

## 2017-06-14 DIAGNOSIS — R54 Age-related physical debility: Secondary | ICD-10-CM | POA: Diagnosis not present

## 2017-06-14 DIAGNOSIS — R634 Abnormal weight loss: Secondary | ICD-10-CM | POA: Diagnosis not present

## 2017-06-14 DIAGNOSIS — E640 Sequelae of protein-calorie malnutrition: Secondary | ICD-10-CM | POA: Diagnosis not present

## 2017-06-14 DIAGNOSIS — I10 Essential (primary) hypertension: Secondary | ICD-10-CM | POA: Diagnosis not present

## 2017-06-14 DIAGNOSIS — E46 Unspecified protein-calorie malnutrition: Secondary | ICD-10-CM | POA: Diagnosis not present

## 2017-06-15 DIAGNOSIS — I10 Essential (primary) hypertension: Secondary | ICD-10-CM | POA: Diagnosis not present

## 2017-06-15 DIAGNOSIS — E640 Sequelae of protein-calorie malnutrition: Secondary | ICD-10-CM | POA: Diagnosis not present

## 2017-06-15 DIAGNOSIS — E46 Unspecified protein-calorie malnutrition: Secondary | ICD-10-CM | POA: Diagnosis not present

## 2017-06-15 DIAGNOSIS — R634 Abnormal weight loss: Secondary | ICD-10-CM | POA: Diagnosis not present

## 2017-06-15 DIAGNOSIS — R630 Anorexia: Secondary | ICD-10-CM | POA: Diagnosis not present

## 2017-06-15 DIAGNOSIS — R54 Age-related physical debility: Secondary | ICD-10-CM | POA: Diagnosis not present

## 2017-06-16 ENCOUNTER — Ambulatory Visit: Admitting: Podiatry

## 2017-06-16 DIAGNOSIS — E46 Unspecified protein-calorie malnutrition: Secondary | ICD-10-CM | POA: Diagnosis not present

## 2017-06-16 DIAGNOSIS — I10 Essential (primary) hypertension: Secondary | ICD-10-CM | POA: Diagnosis not present

## 2017-06-16 DIAGNOSIS — E640 Sequelae of protein-calorie malnutrition: Secondary | ICD-10-CM | POA: Diagnosis not present

## 2017-06-16 DIAGNOSIS — R630 Anorexia: Secondary | ICD-10-CM | POA: Diagnosis not present

## 2017-06-16 DIAGNOSIS — R54 Age-related physical debility: Secondary | ICD-10-CM | POA: Diagnosis not present

## 2017-06-16 DIAGNOSIS — R634 Abnormal weight loss: Secondary | ICD-10-CM | POA: Diagnosis not present

## 2017-06-18 DIAGNOSIS — E46 Unspecified protein-calorie malnutrition: Secondary | ICD-10-CM | POA: Diagnosis not present

## 2017-06-18 DIAGNOSIS — R630 Anorexia: Secondary | ICD-10-CM | POA: Diagnosis not present

## 2017-06-18 DIAGNOSIS — R634 Abnormal weight loss: Secondary | ICD-10-CM | POA: Diagnosis not present

## 2017-06-18 DIAGNOSIS — I10 Essential (primary) hypertension: Secondary | ICD-10-CM | POA: Diagnosis not present

## 2017-06-18 DIAGNOSIS — E640 Sequelae of protein-calorie malnutrition: Secondary | ICD-10-CM | POA: Diagnosis not present

## 2017-06-18 DIAGNOSIS — R54 Age-related physical debility: Secondary | ICD-10-CM | POA: Diagnosis not present

## 2017-06-19 DIAGNOSIS — R634 Abnormal weight loss: Secondary | ICD-10-CM | POA: Diagnosis not present

## 2017-06-19 DIAGNOSIS — E640 Sequelae of protein-calorie malnutrition: Secondary | ICD-10-CM | POA: Diagnosis not present

## 2017-06-19 DIAGNOSIS — E46 Unspecified protein-calorie malnutrition: Secondary | ICD-10-CM | POA: Diagnosis not present

## 2017-06-19 DIAGNOSIS — I10 Essential (primary) hypertension: Secondary | ICD-10-CM | POA: Diagnosis not present

## 2017-06-19 DIAGNOSIS — R54 Age-related physical debility: Secondary | ICD-10-CM | POA: Diagnosis not present

## 2017-06-19 DIAGNOSIS — R630 Anorexia: Secondary | ICD-10-CM | POA: Diagnosis not present

## 2017-06-22 DIAGNOSIS — I10 Essential (primary) hypertension: Secondary | ICD-10-CM | POA: Diagnosis not present

## 2017-06-22 DIAGNOSIS — E640 Sequelae of protein-calorie malnutrition: Secondary | ICD-10-CM | POA: Diagnosis not present

## 2017-06-22 DIAGNOSIS — R54 Age-related physical debility: Secondary | ICD-10-CM | POA: Diagnosis not present

## 2017-06-22 DIAGNOSIS — R630 Anorexia: Secondary | ICD-10-CM | POA: Diagnosis not present

## 2017-06-22 DIAGNOSIS — E46 Unspecified protein-calorie malnutrition: Secondary | ICD-10-CM | POA: Diagnosis not present

## 2017-06-22 DIAGNOSIS — R634 Abnormal weight loss: Secondary | ICD-10-CM | POA: Diagnosis not present

## 2017-06-23 DIAGNOSIS — R634 Abnormal weight loss: Secondary | ICD-10-CM | POA: Diagnosis not present

## 2017-06-23 DIAGNOSIS — I10 Essential (primary) hypertension: Secondary | ICD-10-CM | POA: Diagnosis not present

## 2017-06-23 DIAGNOSIS — E640 Sequelae of protein-calorie malnutrition: Secondary | ICD-10-CM | POA: Diagnosis not present

## 2017-06-23 DIAGNOSIS — R54 Age-related physical debility: Secondary | ICD-10-CM | POA: Diagnosis not present

## 2017-06-23 DIAGNOSIS — R630 Anorexia: Secondary | ICD-10-CM | POA: Diagnosis not present

## 2017-06-23 DIAGNOSIS — E46 Unspecified protein-calorie malnutrition: Secondary | ICD-10-CM | POA: Diagnosis not present

## 2017-06-24 DIAGNOSIS — E640 Sequelae of protein-calorie malnutrition: Secondary | ICD-10-CM | POA: Diagnosis not present

## 2017-06-24 DIAGNOSIS — R634 Abnormal weight loss: Secondary | ICD-10-CM | POA: Diagnosis not present

## 2017-06-24 DIAGNOSIS — E46 Unspecified protein-calorie malnutrition: Secondary | ICD-10-CM | POA: Diagnosis not present

## 2017-06-24 DIAGNOSIS — I10 Essential (primary) hypertension: Secondary | ICD-10-CM | POA: Diagnosis not present

## 2017-06-24 DIAGNOSIS — R630 Anorexia: Secondary | ICD-10-CM | POA: Diagnosis not present

## 2017-06-24 DIAGNOSIS — R54 Age-related physical debility: Secondary | ICD-10-CM | POA: Diagnosis not present

## 2017-06-25 DIAGNOSIS — I10 Essential (primary) hypertension: Secondary | ICD-10-CM | POA: Diagnosis not present

## 2017-06-25 DIAGNOSIS — R630 Anorexia: Secondary | ICD-10-CM | POA: Diagnosis not present

## 2017-06-25 DIAGNOSIS — R54 Age-related physical debility: Secondary | ICD-10-CM | POA: Diagnosis not present

## 2017-06-25 DIAGNOSIS — E46 Unspecified protein-calorie malnutrition: Secondary | ICD-10-CM | POA: Diagnosis not present

## 2017-06-25 DIAGNOSIS — R634 Abnormal weight loss: Secondary | ICD-10-CM | POA: Diagnosis not present

## 2017-06-25 DIAGNOSIS — E640 Sequelae of protein-calorie malnutrition: Secondary | ICD-10-CM | POA: Diagnosis not present

## 2017-06-26 DIAGNOSIS — R630 Anorexia: Secondary | ICD-10-CM | POA: Diagnosis not present

## 2017-06-26 DIAGNOSIS — R54 Age-related physical debility: Secondary | ICD-10-CM | POA: Diagnosis not present

## 2017-06-26 DIAGNOSIS — I10 Essential (primary) hypertension: Secondary | ICD-10-CM | POA: Diagnosis not present

## 2017-06-26 DIAGNOSIS — R634 Abnormal weight loss: Secondary | ICD-10-CM | POA: Diagnosis not present

## 2017-06-26 DIAGNOSIS — E46 Unspecified protein-calorie malnutrition: Secondary | ICD-10-CM | POA: Diagnosis not present

## 2017-06-26 DIAGNOSIS — E640 Sequelae of protein-calorie malnutrition: Secondary | ICD-10-CM | POA: Diagnosis not present

## 2017-06-27 DIAGNOSIS — R634 Abnormal weight loss: Secondary | ICD-10-CM | POA: Diagnosis not present

## 2017-06-27 DIAGNOSIS — E46 Unspecified protein-calorie malnutrition: Secondary | ICD-10-CM | POA: Diagnosis not present

## 2017-06-27 DIAGNOSIS — I10 Essential (primary) hypertension: Secondary | ICD-10-CM | POA: Diagnosis not present

## 2017-06-27 DIAGNOSIS — E640 Sequelae of protein-calorie malnutrition: Secondary | ICD-10-CM | POA: Diagnosis not present

## 2017-06-27 DIAGNOSIS — R630 Anorexia: Secondary | ICD-10-CM | POA: Diagnosis not present

## 2017-06-27 DIAGNOSIS — R54 Age-related physical debility: Secondary | ICD-10-CM | POA: Diagnosis not present

## 2017-06-28 DIAGNOSIS — E640 Sequelae of protein-calorie malnutrition: Secondary | ICD-10-CM | POA: Diagnosis not present

## 2017-06-28 DIAGNOSIS — R634 Abnormal weight loss: Secondary | ICD-10-CM | POA: Diagnosis not present

## 2017-06-28 DIAGNOSIS — I10 Essential (primary) hypertension: Secondary | ICD-10-CM | POA: Diagnosis not present

## 2017-06-28 DIAGNOSIS — R630 Anorexia: Secondary | ICD-10-CM | POA: Diagnosis not present

## 2017-06-28 DIAGNOSIS — E46 Unspecified protein-calorie malnutrition: Secondary | ICD-10-CM | POA: Diagnosis not present

## 2017-06-28 DIAGNOSIS — R54 Age-related physical debility: Secondary | ICD-10-CM | POA: Diagnosis not present

## 2017-06-29 DIAGNOSIS — I10 Essential (primary) hypertension: Secondary | ICD-10-CM | POA: Diagnosis not present

## 2017-06-29 DIAGNOSIS — E46 Unspecified protein-calorie malnutrition: Secondary | ICD-10-CM | POA: Diagnosis not present

## 2017-06-29 DIAGNOSIS — R54 Age-related physical debility: Secondary | ICD-10-CM | POA: Diagnosis not present

## 2017-06-29 DIAGNOSIS — R634 Abnormal weight loss: Secondary | ICD-10-CM | POA: Diagnosis not present

## 2017-06-29 DIAGNOSIS — R630 Anorexia: Secondary | ICD-10-CM | POA: Diagnosis not present

## 2017-06-29 DIAGNOSIS — E640 Sequelae of protein-calorie malnutrition: Secondary | ICD-10-CM | POA: Diagnosis not present

## 2017-06-30 DIAGNOSIS — E46 Unspecified protein-calorie malnutrition: Secondary | ICD-10-CM | POA: Diagnosis not present

## 2017-06-30 DIAGNOSIS — R634 Abnormal weight loss: Secondary | ICD-10-CM | POA: Diagnosis not present

## 2017-06-30 DIAGNOSIS — E640 Sequelae of protein-calorie malnutrition: Secondary | ICD-10-CM | POA: Diagnosis not present

## 2017-06-30 DIAGNOSIS — I10 Essential (primary) hypertension: Secondary | ICD-10-CM | POA: Diagnosis not present

## 2017-06-30 DIAGNOSIS — R54 Age-related physical debility: Secondary | ICD-10-CM | POA: Diagnosis not present

## 2017-06-30 DIAGNOSIS — R630 Anorexia: Secondary | ICD-10-CM | POA: Diagnosis not present

## 2017-07-01 DIAGNOSIS — R54 Age-related physical debility: Secondary | ICD-10-CM | POA: Diagnosis not present

## 2017-07-01 DIAGNOSIS — I10 Essential (primary) hypertension: Secondary | ICD-10-CM | POA: Diagnosis not present

## 2017-07-01 DIAGNOSIS — E46 Unspecified protein-calorie malnutrition: Secondary | ICD-10-CM | POA: Diagnosis not present

## 2017-07-01 DIAGNOSIS — R630 Anorexia: Secondary | ICD-10-CM | POA: Diagnosis not present

## 2017-07-01 DIAGNOSIS — R634 Abnormal weight loss: Secondary | ICD-10-CM | POA: Diagnosis not present

## 2017-07-01 DIAGNOSIS — E640 Sequelae of protein-calorie malnutrition: Secondary | ICD-10-CM | POA: Diagnosis not present

## 2017-07-03 DEATH — deceased

## 2017-07-15 ENCOUNTER — Ambulatory Visit: Payer: Medicaid Other | Admitting: Podiatry
